# Patient Record
Sex: Male | Born: 1965 | Race: White | Hispanic: No | Marital: Married
Health system: Western US, Academic
[De-identification: ages and names within clinical notes are randomized; demographics above are authoritative.]

## PROBLEM LIST (undated history)

## (undated) DIAGNOSIS — I10 Essential (primary) hypertension: Secondary | ICD-10-CM

## (undated) DIAGNOSIS — E785 Hyperlipidemia, unspecified: Secondary | ICD-10-CM

## (undated) DIAGNOSIS — G4730 Sleep apnea, unspecified: Secondary | ICD-10-CM

## (undated) MED ORDER — AMINOLEVULINIC ACID HCL 30 MG/ML ORAL SOLUTION
30 | ORAL | Status: AC
Start: ? — End: 2020-01-12

## (undated) MED ORDER — LACTATED RINGERS INTRAVENOUS SOLUTION: INTRAVENOUS | Status: AC

---
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
## 2018-01-17 ENCOUNTER — Emergency Department (HOSPITAL_COMMUNITY): Payer: Medicaid Other

## 2018-01-17 ENCOUNTER — Encounter (HOSPITAL_COMMUNITY): Payer: Self-pay

## 2018-01-17 ENCOUNTER — Other Ambulatory Visit: Payer: Self-pay

## 2018-01-17 ENCOUNTER — Inpatient Hospital Stay (HOSPITAL_COMMUNITY)
Admission: EM | Admit: 2018-01-17 | Discharge: 2018-01-19 | DRG: 195 | Disposition: A | Discharge status: Home / Self Care | Payer: Medicaid Other | Attending: Internal Medicine | Admitting: Internal Medicine

## 2018-01-17 DIAGNOSIS — I10 Essential (primary) hypertension: Secondary | ICD-10-CM | POA: Diagnosis present

## 2018-01-17 DIAGNOSIS — J13 Pneumonia due to Streptococcus pneumoniae: Secondary | ICD-10-CM | POA: Diagnosis not present

## 2018-01-17 DIAGNOSIS — Z7982 Long term (current) use of aspirin: Secondary | ICD-10-CM | POA: Diagnosis not present

## 2018-01-17 DIAGNOSIS — J181 Lobar pneumonia, unspecified organism: Secondary | ICD-10-CM

## 2018-01-17 DIAGNOSIS — E782 Mixed hyperlipidemia: Secondary | ICD-10-CM | POA: Diagnosis not present

## 2018-01-17 DIAGNOSIS — Z79899 Other long term (current) drug therapy: Secondary | ICD-10-CM | POA: Diagnosis not present

## 2018-01-17 DIAGNOSIS — R0902 Hypoxemia: Secondary | ICD-10-CM | POA: Diagnosis present

## 2018-01-17 DIAGNOSIS — E785 Hyperlipidemia, unspecified: Secondary | ICD-10-CM | POA: Diagnosis present

## 2018-01-17 DIAGNOSIS — J180 Bronchopneumonia, unspecified organism: Principal | ICD-10-CM | POA: Diagnosis present

## 2018-01-17 DIAGNOSIS — G4733 Obstructive sleep apnea (adult) (pediatric): Secondary | ICD-10-CM | POA: Diagnosis present

## 2018-01-17 DIAGNOSIS — J189 Pneumonia, unspecified organism: Secondary | ICD-10-CM | POA: Diagnosis present

## 2018-01-17 DIAGNOSIS — E7841 Elevated Lipoprotein(a): Secondary | ICD-10-CM | POA: Diagnosis not present

## 2018-01-17 HISTORY — DX: Essential (primary) hypertension: I10

## 2018-01-17 HISTORY — DX: Sleep apnea, unspecified: G47.30

## 2018-01-17 HISTORY — DX: Hyperlipidemia, unspecified: E78.5

## 2018-01-17 LAB — URINALYSIS, ROUTINE W REFLEX MICROSCOPIC
BILIRUBIN URINE: NEGATIVE
GLUCOSE, UA: NEGATIVE mg/dL
HGB URINE DIPSTICK: NEGATIVE
Ketones, ur: NEGATIVE mg/dL
Leukocytes, UA: NEGATIVE
Nitrite: NEGATIVE
PH: 6 (ref 5.0–8.0)
Protein, ur: NEGATIVE mg/dL
SPECIFIC GRAVITY, URINE: 1.006 (ref 1.005–1.030)

## 2018-01-17 LAB — CBC WITH DIFFERENTIAL/PLATELET
BASOS ABS: 0 10*3/uL (ref 0.0–0.1)
Basophils Relative: 0 %
EOS PCT: 1 %
Eosinophils Absolute: 0.1 10*3/uL (ref 0.0–0.7)
HCT: 39.8 % (ref 39.0–52.0)
Hemoglobin: 14.1 g/dL (ref 13.0–17.0)
LYMPHS PCT: 21 %
Lymphs Abs: 1.8 10*3/uL (ref 0.7–4.0)
MCH: 34.6 pg — ABNORMAL HIGH (ref 26.0–34.0)
MCHC: 35.4 g/dL (ref 30.0–36.0)
MCV: 97.8 fL (ref 78.0–100.0)
MONO ABS: 1.3 10*3/uL — AB (ref 0.1–1.0)
MONOS PCT: 15 %
NEUTROS PCT: 63 %
Neutro Abs: 5.5 10*3/uL (ref 1.7–7.7)
PLATELETS: 200 10*3/uL (ref 150–400)
RBC: 4.07 MIL/uL — ABNORMAL LOW (ref 4.22–5.81)
RDW: 13.5 % (ref 11.5–15.5)
WBC: 8.8 10*3/uL (ref 4.0–10.5)

## 2018-01-17 LAB — BASIC METABOLIC PANEL
ANION GAP: 8 (ref 5–15)
BUN: 15 mg/dL (ref 6–20)
CHLORIDE: 104 mmol/L (ref 98–111)
CO2: 26 mmol/L (ref 22–32)
CREATININE: 1.3 mg/dL — AB (ref 0.61–1.24)
Calcium: 9.4 mg/dL (ref 8.9–10.3)
GFR calc Af Amer: >60 mL/min (ref >60)
GFR calc non Af Amer: >60 mL/min (ref >60)
Glucose, Bld: 166 mg/dL — ABNORMAL HIGH (ref 70–99)
Potassium: 3.8 mmol/L (ref 3.5–5.1)
Sodium: 138 mmol/L (ref 135–145)

## 2018-01-17 LAB — I-STAT TROPONIN, ED: Troponin i, poc: 0.01 ng/mL (ref 0.00–0.08)

## 2018-01-17 MED ORDER — IOPAMIDOL (ISOVUE-370) INJECTION 76%
100.0000 mL | Freq: Once | INTRAVENOUS | Status: AC | PRN
  Administered 2018-01-17: 100 mL via INTRAVENOUS

## 2018-01-17 MED ORDER — SODIUM CHLORIDE 0.9 % IV BOLUS
1000.0000 mL | Freq: Once | INTRAVENOUS | Status: AC
  Administered 2018-01-17: 1000 mL via INTRAVENOUS

## 2018-01-17 MED ORDER — SODIUM CHLORIDE 0.9 % IV SOLN
1.0000 g | Freq: Once | INTRAVENOUS | Status: AC
  Administered 2018-01-17: 1 g via INTRAVENOUS
  Filled 2018-01-17: qty 10

## 2018-01-17 MED ORDER — SODIUM CHLORIDE 0.9 % IV SOLN
500.0000 mg | Freq: Once | INTRAVENOUS | Status: AC
  Administered 2018-01-17: 500 mg via INTRAVENOUS
  Filled 2018-01-17: qty 500

## 2018-01-17 MED ORDER — IOPAMIDOL (ISOVUE-370) INJECTION 76%
INTRAVENOUS | Status: AC
  Filled 2018-01-17: qty 100

## 2018-01-17 MED ORDER — IPRATROPIUM-ALBUTEROL 0.5-2.5 (3) MG/3ML IN SOLN
3.0000 mL | Freq: Once | RESPIRATORY_TRACT | Status: AC
  Administered 2018-01-17: 3 mL via RESPIRATORY_TRACT
  Filled 2018-01-17: qty 3

## 2018-01-17 NOTE — ED Notes (Signed)
AWARE OF NEED FOR URINE 

## 2018-01-17 NOTE — ED Triage Notes (Signed)
Per [facility]- Pt resides at home- Seen at [facility] DX pneumonia Neg flu. C/o shortness of breath, respiratory distress, body aches, fever, chest wall with upon coughing x 1 week 02 sat 81 % RA Placed on 4LNC Wheezing and Rhonchi throughout. 5 mg albuterol NEB and 125 mg  solumedrol IVP in route. Tylenol 1000 mg po given for fever. Dark urine for 4 days.

## 2018-01-17 NOTE — ED Notes (Signed)
ED TO INPATIENT HANDOFF REPORT  Name/Age/Gender Richard Stevenson 52 y.o. male  Code Status   Home/SNF/Other Home  Chief Complaint Respiratory Distress  Level of Care/Admitting Diagnosis ED Disposition    ED Disposition Condition [facility] Area: [place] [[id number]]  Level of Care: Telemetry [5]  Admit to tele based on following criteria: Other see comments  Comments: hypoxia  Diagnosis: Pneumonia [614431]  Admitting Physician: Elwyn Reach [[id number]]  Attending Physician: Elwyn Reach [[id number]]  Estimated length of stay: past midnight tomorrow  Certification:: I certify this patient will need inpatient services for at least 2 midnights  PT Class (Do Not Modify): Inpatient [101]  PT Acc Code (Do Not Modify): Private [1]       Medical History Past Medical History:  Diagnosis Date  . Hyperlipidemia   . Hypertension   . Sleep apnea     Allergies No Known Allergies  IV Location/Drains/Wounds Patient Lines/Drains/Airways Status   Active Line/Drains/Airways    Name:   Placement date:   Placement time:   Site:   Days:   Peripheral IV 01/17/18 Right Antecubital   01/17/18    1632    Antecubital   less than 1          Labs/Imaging Results for orders placed or performed during the hospital encounter of 01/17/18 (from the past 48 hour(s))  Basic metabolic panel     Status: Abnormal   Collection Time: 01/17/18  5:05 PM  Result Value Ref Range   Sodium 138 135 - 145 mmol/L   Potassium 3.8 3.5 - 5.1 mmol/L   Chloride 104 98 - 111 mmol/L   CO2 26 22 - 32 mmol/L   Glucose, Bld 166 (H) 70 - 99 mg/dL   BUN 15 6 - 20 mg/dL   Creatinine, Ser 1.30 (H) 0.61 - 1.24 mg/dL   Calcium 9.4 8.9 - 10.3 mg/dL   GFR calc non Af Amer >60 >60 mL/min   GFR calc Af Amer >60 >60 mL/min    Comment: (NOTE) The eGFR has been calculated using the CKD EPI equation. This calculation has not been validated in all clinical situations. eGFR's persistently  <60 mL/min signify possible Chronic Kidney Disease.    Anion gap 8 5 - 15    Comment: Performed at [facility], [place] [address] [place], [place] [address]  CBC with Differential     Status: Abnormal   Collection Time: 01/17/18  5:05 PM  Result Value Ref Range   WBC 8.8 4.0 - 10.5 K/uL   RBC 4.07 (L) 4.22 - 5.81 MIL/uL   Hemoglobin 14.1 13.0 - 17.0 g/dL   HCT 39.8 39.0 - 52.0 %   MCV 97.8 78.0 - 100.0 fL   MCH 34.6 (H) 26.0 - 34.0 pg   MCHC 35.4 30.0 - 36.0 g/dL   RDW 13.5 11.5 - 15.5 %   Platelets 200 150 - 400 K/uL   Neutrophils Relative % 63 %   Neutro Abs 5.5 1.7 - 7.7 K/uL   Lymphocytes Relative 21 %   Lymphs Abs 1.8 0.7 - 4.0 K/uL   Monocytes Relative 15 %   Monocytes Absolute 1.3 (H) 0.1 - 1.0 K/uL   Eosinophils Relative 1 %   Eosinophils Absolute 0.1 0.0 - 0.7 K/uL   Basophils Relative 0 %   Basophils Absolute 0.0 0.0 - 0.1 K/uL    Comment: Performed at [facility], [address] [place], [place]  [address]  Urinalysis, Routine w reflex microscopic     Status: None   Collection Time: 01/17/18  5:05 PM  Result Value Ref Range   Color, Urine YELLOW YELLOW   APPearance CLEAR CLEAR   Specific Gravity, Urine 1.006 1.005 - 1.030   pH 6.0 5.0 - 8.0   Glucose, UA NEGATIVE NEGATIVE mg/dL   Hgb urine dipstick NEGATIVE NEGATIVE   Bilirubin Urine NEGATIVE NEGATIVE   Ketones, ur NEGATIVE NEGATIVE mg/dL   Protein, ur NEGATIVE NEGATIVE mg/dL   Nitrite NEGATIVE NEGATIVE   Leukocytes, UA NEGATIVE NEGATIVE    Comment: Performed at [facility], [place] [address], [place], [place] [address]  I-stat troponin, ED     Status: None   Collection Time: 01/17/18  5:11 PM  Result Value Ref Range   Troponin i, poc 0.01 0.00 - 0.08 ng/mL   Comment 3            Comment: Due to the release kinetics of cTnI, a negative result within the first hours of the onset of symptoms does not rule out myocardial infarction with certainty. If  myocardial infarction is still suspected, repeat the test at appropriate intervals.    Dg Chest 2 View  Result Date: 01/17/2018 CLINICAL DATA:  One-week history of cough, shortness of breath, myalgias, fever and chest wall pain with coughing. Hypoxemia. Wheezing and ronchi on auscultatory examination. EXAM: CHEST - 2 VIEW COMPARISON:  None. FINDINGS: Cardiac silhouette upper normal in size. Hilar and mediastinal contours unremarkable. Streaky and patchy opacities involving the LEFT LOWER LOBE. Lungs otherwise clear. Pulmonary vascularity normal. No pleural effusions. Visualized bony thorax intact. IMPRESSION: LEFT LOWER LOBE bronchopneumonia. Electronically Signed   By: Evangeline Dakin M.D.   On: 01/17/2018 17:53   Ct Angio Chest Pe W/cm &/or Wo Cm  Result Date: 01/17/2018 CLINICAL DATA:  shortness of breath, respiratory distress, body aches, fever, chest wall with upon coughing x 1 week 02 sat 81 % RA Placed on 4LNC Wheezing and Rhonchi throughout. 5 mg albuterol NEB and 125 mg solumedrol IVP in routePatient coughed through entire first sequence, repeat bolus administered EXAM: CT ANGIOGRAPHY CHEST WITH CONTRAST TECHNIQUE: Multidetector CT imaging of the chest was performed using the standard protocol during bolus administration of intravenous contrast. Multiplanar CT image reconstructions and MIPs were obtained to evaluate the vascular anatomy. CONTRAST:  139m ISOVUE-370 IOPAMIDOL (ISOVUE-370) INJECTION 76% COMPARISON:  None. FINDINGS: Cardiovascular: Heart size upper limits normal. No pericardial effusion. Satisfactory opacification of pulmonary arteries noted, and there is no evidence of pulmonary emboli. Adequate contrast opacification of the thoracic aorta with no evidence of dissection, aneurysm, or stenosis. There is classic 3-vessel brachiocephalic arch anatomy without proximal stenosis. No significant atheromatous plaque. Visualized proximal abdominal aorta unremarkable. Mediastinum/Nodes:  Inferior right hilar lymph node 1.3 cm diameter. No other hilar or mediastinal adenopathy. Lungs/Pleura: Scattered Airspace opacities in the basilar segments of the left lower lobe. Right lung clear. No pleural effusion. No pneumothorax. Upper Abdomen: 3 cm soft tissue attenuation left adrenal nodule. No acute findings. Musculoskeletal: No chest wall abnormality. No acute or significant osseous findings. Review of the MIP images confirms the above findings. IMPRESSION: 1. Negative for acute PE or thoracic aortic dissection. 2. Scattered airspace opacities in the left lower lobe suggesting pneumonia. 3. Single enlarged inferior left hilar lymph node, possibly reactive but nonspecific. 4. 3 cm left adrenal nodule, statistically most likely adenoma in the absence of a history of primary carcinoma although the CT findings are not  diagnostic. When the patient is clinically stable and able to follow directions and hold their breath (preferably as an outpatient) further evaluation with dedicated abdominal MRI should be considered. Electronically Signed   By: Lucrezia Europe M.D.   On: 01/17/2018 19:45    Pending Labs Unresulted Labs (From admission, onward)   None      Vitals/Pain Today's Vitals   01/17/18 1717 01/17/18 1809 01/17/18 1830 01/17/18 1858  BP:  139/61 (!) 162/66   Pulse:  82 83   Resp:  18 20   Temp:  99 F (37.2 C)    TempSrc:  Oral    SpO2:  91% 93%   Weight: 117 kg     Height: _0  (1.803 m)     PainSc:    1     Isolation Precautions No active isolations  Medications Medications  azithromycin (ZITHROMAX) 500 mg in sodium chloride 0.9 % 250 mL IVPB (500 mg Intravenous New Bag/Given 01/17/18 1954)  iopamidol (ISOVUE-370) 76 % injection (has no administration in time range)  ipratropium-albuterol (DUONEB) 0.5-2.5 (3) MG/3ML nebulizer solution 3 mL (3 mLs Nebulization Given 01/17/18 1707)  sodium chloride 0.9 % bolus 1,000 mL (0 mLs Intravenous Stopped 01/17/18 1915)  cefTRIAXone  (ROCEPHIN) 1 g in sodium chloride 0.9 % 100 mL IVPB (0 g Intravenous Stopped 01/17/18 1951)  iopamidol (ISOVUE-370) 76 % injection 100 mL (100 mLs Intravenous Contrast Given 01/17/18 1850)    Mobility walks

## 2018-01-17 NOTE — ED Notes (Signed)
Still unable to void at this time.

## 2018-01-17 NOTE — ED Notes (Signed)
Bed: [id number] Expected date: 01/17/18 Expected time: 4:22 PM Means of arrival: Ambulance Comments: Resp diff, neb treatment

## 2018-01-17 NOTE — H&P (Signed)
History and Physical   [name] WUJ:[id number] DOB: 31-Aug-1965 DOA: 01/17/2018  Referring MD/NP/PA: Dietrich Pates, PA  PCP: Patient, No Pcp Per   Patient coming from: Home  Chief Complaint: Shortness of breath cough and fever  HPI: Richard Stevenson is a 52 y.o. male with medical history significant of with history of obstructive sleep apnea on CPAP, hypertension, hyperlipidemia who apparently has been using humidifier on his CPAP but has not used it for about a week.  It has dried out.  He normally cleans it with special liquid and has been doing that without any problem.  He started having generalized fever chills cough and shortness of breath about 10 days now.  This has progressed to become more productive of yellow mucus.  Symptoms have gotten worse in the last 3 days patient is having exertional dyspnea.  He came to the ER where he was evaluated.  He was found to have evidence of pneumonia.  Patient was also hypoxic requiring 3 L of oxygen to maintain oxygen sat of 92%.  He denied any sick contact.  Denied any recent travel.  Patient believes his CPAP has something to do with his symptoms.  He has taken Mucinex but no antibiotics in the last 4 days.  He has not been on oxygen and notes smoking history.  No history of asthma.  Patient is therefore being admitted with pneumonia requiring oxygen for full evaluation..  ED Course: Patient's temperature is 100.4, his blood pressure 162/66 pulse 87 respiratory of 21 oxygen sat 81% on room air and 93% on 3 L.  His chemistry appears stable except for creatinine 1.3 and glucose of 166 white count is 8.8 hemoglobin 14.1 platelets 200.  Urinalysis is entirely negative.  Chest x-ray showed left lower lobe bronchopneumonia, CT angiogram of the chest showed no PE but scattered airspace opacities in the left lower lobe suggesting pneumonia  Review of Systems: As per HPI otherwise 10 point review of systems negative.    Past Medical History:  Diagnosis Date    . Hyperlipidemia   . Hypertension   . Sleep apnea     History reviewed. No pertinent surgical history.   reports that he has never smoked. He does not have any smokeless tobacco history on file. He reports that he drank alcohol. He reports that he does not use drugs.  No Known Allergies  History reviewed. No pertinent family history.   Prior to Admission medications   Medication Sig Start Date End Date Taking? Authorizing Provider  aspirin EC 81 MG tablet Take 81 mg by mouth daily.   Yes [provider]  cholecalciferol (VITAMIN D) 1000 units tablet Take 1,000 Units by mouth daily.   Yes [provider]  Cyanocobalamin (VITAMIN B-12 CR PO) Take 1 tablet by mouth daily.   Yes [provider]  guaiFENesin (MUCINEX) 600 MG 12 hr tablet Take 600 mg by mouth 2 (two) times daily as needed for cough or to loosen phlegm.   Yes [provider]  Multiple Vitamin (MULTIVITAMIN WITH MINERALS) TABS tablet Take 1 tablet by mouth daily.   Yes [provider]  pyridOXINE (VITAMIN B-6) 100 MG tablet Take 100 mg by mouth daily.   Yes [provider]    Physical Exam: Vitals:   01/17/18 1717 01/17/18 1809 01/17/18 1830 01/17/18 2015  BP:  139/61 (!) 162/66 134/64  Pulse:  82 83 69  Resp:  18 20 20   Temp:  99 F (37.2 C)  TempSrc:  Oral    SpO2:  91% 93% 93%  Weight: 117 kg     Height: 5\' 11"  (1.803 m)         Constitutional: NAD, calm, comfortable Vitals:   01/17/18 1717 01/17/18 1809 01/17/18 1830 01/17/18 2015  BP:  139/61 (!) 162/66 134/64  Pulse:  82 83 69  Resp:  18 20 20   Temp:  99 F (37.2 C)    TempSrc:  Oral    SpO2:  91% 93% 93%  Weight: 117 kg     Height: 5\' 11"  (1.803 m)      Eyes: PERRL, lids and conjunctivae normal ENMT: Mucous membranes are moist. Posterior pharynx clear of any exudate or lesions.Normal dentition.  Neck: normal, supple, no masses, no thyromegaly Respiratory: clear to auscultation  bilaterally, no wheezing, no crackles. Normal respiratory effort. No accessory muscle use.  Cardiovascular: Regular rate and rhythm, no murmurs / rubs / gallops. No extremity edema. 2+ pedal pulses. No carotid bruits.  Abdomen: no tenderness, no masses palpated. No hepatosplenomegaly. Bowel sounds positive.  Musculoskeletal: no clubbing / cyanosis. No joint deformity upper and lower extremities. Good ROM, no contractures. Normal muscle tone.  Skin: no rashes, lesions, ulcers. No induration Neurologic: CN 2-12 grossly intact. Sensation intact, DTR normal. Strength 5/5 in all 4.  Psychiatric: Normal judgment and insight. Alert and oriented x 3. Normal mood.     Labs on Admission: I have personally reviewed following labs and imaging studies  CBC: Recent Labs  Lab 01/17/18 1705  WBC 8.8  NEUTROABS 5.5  HGB 14.1  HCT 39.8  MCV 97.8  PLT 200   Basic Metabolic Panel: Recent Labs  Lab 01/17/18 1705  NA 138  K 3.8  CL 104  CO2 26  GLUCOSE 166*  BUN 15  CREATININE 1.30*  CALCIUM 9.4   GFR: Estimated Creatinine Clearance: 85.5 mL/min (A) (by C-G formula based on SCr of 1.3 mg/dL (H)). Liver Function Tests: No results for input(s): AST, ALT, ALKPHOS, BILITOT, PROT, ALBUMIN in the last 168 hours. No results for input(s): LIPASE, AMYLASE in the last 168 hours. No results for input(s): AMMONIA in the last 168 hours. Coagulation Profile: No results for input(s): INR, PROTIME in the last 168 hours. Cardiac Enzymes: No results for input(s): CKTOTAL, CKMB, CKMBINDEX, TROPONINI in the last 168 hours. BNP (last 3 results) No results for input(s): PROBNP in the last 8760 hours. HbA1C: No results for input(s): HGBA1C in the last 72 hours. CBG: No results for input(s): GLUCAP in the last 168 hours. Lipid Profile: No results for input(s): CHOL, HDL, LDLCALC, TRIG, CHOLHDL, LDLDIRECT in the last 72 hours. Thyroid Function Tests: No results for input(s): TSH, T4TOTAL, FREET4, T3FREE,  THYROIDAB in the last 72 hours. Anemia Panel: No results for input(s): VITAMINB12, FOLATE, FERRITIN, TIBC, IRON, RETICCTPCT in the last 72 hours. Urine analysis:    Component Value Date/Time   COLORURINE YELLOW 01/17/2018 1705   APPEARANCEUR CLEAR 01/17/2018 1705   LABSPEC 1.006 01/17/2018 1705   PHURINE 6.0 01/17/2018 1705   GLUCOSEU NEGATIVE 01/17/2018 1705   HGBUR NEGATIVE 01/17/2018 1705   BILIRUBINUR NEGATIVE 01/17/2018 1705   KETONESUR NEGATIVE 01/17/2018 1705   PROTEINUR NEGATIVE 01/17/2018 1705   NITRITE NEGATIVE 01/17/2018 1705   LEUKOCYTESUR NEGATIVE 01/17/2018 1705   Sepsis Labs: @LABRCNTIP (procalcitonin:4,lacticidven:4) )No results found for this or any previous visit (from the past 240 hour(s)).   Radiological Exams on Admission: Dg Chest 2 View  Result Date: 01/17/2018 CLINICAL DATA:  One-week  history of cough, shortness of breath, myalgias, fever and chest wall pain with coughing. Hypoxemia. Wheezing and ronchi on auscultatory examination. EXAM: CHEST - 2 VIEW COMPARISON:  None. FINDINGS: Cardiac silhouette upper normal in size. Hilar and mediastinal contours unremarkable. Streaky and patchy opacities involving the LEFT LOWER LOBE. Lungs otherwise clear. Pulmonary vascularity normal. No pleural effusions. Visualized bony thorax intact. IMPRESSION: LEFT LOWER LOBE bronchopneumonia. Electronically Signed   By: Hulan Saashomas  Lawrence M.D.   On: 01/17/2018 17:53   Ct Angio Chest Pe W/cm &/or Wo Cm  Result Date: 01/17/2018 CLINICAL DATA:  shortness of breath, respiratory distress, body aches, fever, chest wall with upon coughing x 1 week 02 sat 81 % RA Placed on 4LNC Wheezing and Rhonchi throughout. 5 mg albuterol NEB and 125 mg solumedrol IVP in routePatient coughed through entire first sequence, repeat bolus administered EXAM: CT ANGIOGRAPHY CHEST WITH CONTRAST TECHNIQUE: Multidetector CT imaging of the chest was performed using the standard protocol during bolus administration of  intravenous contrast. Multiplanar CT image reconstructions and MIPs were obtained to evaluate the vascular anatomy. CONTRAST:  100mL ISOVUE-370 IOPAMIDOL (ISOVUE-370) INJECTION 76% COMPARISON:  None. FINDINGS: Cardiovascular: Heart size upper limits normal. No pericardial effusion. Satisfactory opacification of pulmonary arteries noted, and there is no evidence of pulmonary emboli. Adequate contrast opacification of the thoracic aorta with no evidence of dissection, aneurysm, or stenosis. There is classic 3-vessel brachiocephalic arch anatomy without proximal stenosis. No significant atheromatous plaque. Visualized proximal abdominal aorta unremarkable. Mediastinum/Nodes: Inferior right hilar lymph node 1.3 cm diameter. No other hilar or mediastinal adenopathy. Lungs/Pleura: Scattered Airspace opacities in the basilar segments of the left lower lobe. Right lung clear. No pleural effusion. No pneumothorax. Upper Abdomen: 3 cm soft tissue attenuation left adrenal nodule. No acute findings. Musculoskeletal: No chest wall abnormality. No acute or significant osseous findings. Review of the MIP images confirms the above findings. IMPRESSION: 1. Negative for acute PE or thoracic aortic dissection. 2. Scattered airspace opacities in the left lower lobe suggesting pneumonia. 3. Single enlarged inferior left hilar lymph node, possibly reactive but nonspecific. 4. 3 cm left adrenal nodule, statistically most likely adenoma in the absence of a history of primary carcinoma although the CT findings are not diagnostic. When the patient is clinically stable and able to follow directions and hold their breath (preferably as an outpatient) further evaluation with dedicated abdominal MRI should be considered. Electronically Signed   By: Corlis Leak  Hassell M.D.   On: 01/17/2018 19:45    EKG: Independently reviewed.  Normal sinus rhythm.  Assessment/Plan Active Problems:   Pneumonia   OSA (obstructive sleep apnea)   HTN  (hypertension)   Hyperlipidemia     #1 left lower lobe pneumonia: Most likely community-acquired.  Patient worried about his CPAP being implicated.  Less likely to be Legionella more likely to be pneumococcal.  We will admit the patient and start him on Rocephin and Zithromax.  Get sputum for culture and sensitivity.  Screen for both streptococcal antigen as well as Legionella.  Maintain on oxygen and titrate off.  #2 acute hypoxemia: No history of COPD or asthma.  Not on home O2.  Patient is currently on 3 L of oxygen.  Titrate oxygen of if possible.  #3 obstructive sleep apnea: Patient has CPAP from home.  Continue CPAP in the hospital.  #4 hypertension: Continue monitoring.  Not on any medication at the moment.  Blood pressure appears controlled  #5 hyperlipidemia: Continue home regimen as well.  Not on any statin.  Dietary control palpation   DVT prophylaxis: Left Code Status: Full code Family Communication: No family available  disposition Plan: Home Consults called: None Admission status: Inpatient to telemetry  Severity of Illness: The appropriate patient status for this patient is INPATIENT. Inpatient status is judged to be reasonable and necessary in order to provide the required intensity of service to ensure the patient's safety. The patient's presenting symptoms, physical exam findings, and initial radiographic and laboratory data in the context of their chronic comorbidities is felt to place them at high risk for further clinical deterioration. Furthermore, it is not anticipated that the patient will be medically stable for discharge from the hospital within 2 midnights of admission. The following factors support the patient status of inpatient.   " The patient's presenting symptoms include cough shortness of breath and wheezing. " The worrisome physical exam findings include bilateral crackles and decreased air entry with hypoxemia. " The initial radiographic and laboratory  data are worrisome because of left bronchopneumonia on chest x-ray and CT scan. " The chronic co-morbidities include obstructive sleep.   * I certify that at the point of admission it is my clinical judgment that the patient will require inpatient hospital care spanning beyond 2 midnights from the point of admission due to high intensity of service, high risk for further deterioration and high frequency of surveillance required.Lonia Blood MD [facility] Pager [phone]  If 7PM-7AM, please contact night-coverage [email or web address] [facility]  01/17/2018, 9:42 PM

## 2018-01-17 NOTE — ED Notes (Signed)
Patient transported to X-ray 

## 2018-01-17 NOTE — ED Notes (Signed)
PT 02 drop to 85% while ambulating on 3L 93% on 4L

## 2018-01-17 NOTE — ED Notes (Signed)
Pt given an urinal and made aware of need for urine specimen. 

## 2018-01-17 NOTE — ED Provider Notes (Addendum)
[facility]-EMERGENCY DEPT Provider Note   CSN: [id number] Arrival date & time: 01/17/18  1624     History   Chief Complaint Chief Complaint  Patient presents with  . Shortness of Breath  . Fever  . Generalized Body Aches  . Pneumonia    HPI Richard SolianMark Stevenson is a 52 y.o. male with a past medical history of hypertension, hyperlipidemia not currently on medication for either, OSA on CPAP nightly, who presents to ED for evaluation of 10-day history of shortness of breath, subjective fever, generalized body aches, cough productive with mucus, chest discomfort that has worsened in the past 3 days.  Patient was seen and evaluated in minute clinic and was told to come to the ED for respiratory distress and possible pneumonia.  Negative flu test done there.  Patient states that he took a few doses of over-the-counter Mucinex which he thought helped his symptoms 4 days ago.  He does not wear home oxygen.  He denies any tobacco use.  Patient was given Tylenol prior to arrival.  He reports decreased appetite as well.  Denies any tick bites, rashes, leg swelling, recent surgeries, recent prolonged travel, hormone use, history of PE, MI or DVT or sick contacts with similar symptoms. Patient discloses to me that he is unsure if his respiratory infection is related to the distilled water that he uses in his CPAP machine last week.  He ensures me that he keeps up with the maintenance of his CPAP machine and cleans it regularly.  HPI  Past Medical History:  Diagnosis Date  . Hyperlipidemia   . Hypertension   . Sleep apnea     There are no active problems to display for this patient.   History reviewed. No pertinent surgical history.      Home Medications    Prior to Admission medications   Medication Sig Start Date End Date Taking? Authorizing Provider  aspirin EC 81 MG tablet Take 81 mg by mouth daily.   Yes [provider]  cholecalciferol (VITAMIN D) 1000  units tablet Take 1,000 Units by mouth daily.   Yes [provider]  Cyanocobalamin (VITAMIN B-12 CR PO) Take 1 tablet by mouth daily.   Yes [provider]  guaiFENesin (MUCINEX) 600 MG 12 hr tablet Take 600 mg by mouth 2 (two) times daily as needed for cough or to loosen phlegm.   Yes [provider]  Multiple Vitamin (MULTIVITAMIN WITH MINERALS) TABS tablet Take 1 tablet by mouth daily.   Yes [provider]  pyridOXINE (VITAMIN B-6) 100 MG tablet Take 100 mg by mouth daily.   Yes [provider]    Family History History reviewed. No pertinent family history.  Social History Social History   Tobacco Use  . Smoking status: Never Smoker  Substance Use Topics  . Alcohol use: Not Currently  . Drug use: Never     Allergies   Patient has no known allergies.   Review of Systems Review of Systems  Constitutional: Positive for chills. Negative for appetite change and fever.  HENT: Negative for ear pain, rhinorrhea, sneezing and sore throat.   Eyes: Negative for photophobia and visual disturbance.  Respiratory: Positive for cough, shortness of breath and wheezing. Negative for chest tightness.   Cardiovascular: Positive for chest pain. Negative for palpitations.  Gastrointestinal: Negative for abdominal pain, blood in stool, constipation, diarrhea, nausea and vomiting.  Genitourinary: Negative for dysuria, hematuria and urgency.  Musculoskeletal: Positive for myalgias.  Skin:  Negative for rash.  Neurological: Negative for dizziness, weakness and light-headedness.     Physical Exam Updated Vital Signs BP (!) 162/66   Pulse 83   Temp 99 F (37.2 C) (Oral)   Resp 20   Ht 5\' 11"  (1.803 m)   Wt 117 kg   SpO2 93%   BMI 35.98 kg/m   Physical Exam  Constitutional: He appears well-developed and well-nourished. No distress.  3L of oxygen being delivered via nasal cannula.  HENT:  Head: Normocephalic and atraumatic.  Nose: Nose  normal.  Eyes: Conjunctivae and EOM are normal. Left eye exhibits no discharge. No scleral icterus.  Neck: Normal range of motion. Neck supple.  Cardiovascular: Normal rate, regular rhythm, normal heart sounds and intact distal pulses. Exam reveals no gallop and no friction rub.  No murmur heard. Pulmonary/Chest: Effort normal. Tachypnea noted. No respiratory distress. He has decreased breath sounds in the left middle field and the left lower field. He has wheezes in the left middle field and the left lower field.  Abdominal: Soft. Bowel sounds are normal. He exhibits no distension. There is no tenderness. There is no guarding.  Musculoskeletal: Normal range of motion. He exhibits no edema.  No lower extremity edema, erythema or calf tenderness bilaterally.  Neurological: He is alert. He exhibits normal muscle tone. Coordination normal.  Skin: Skin is warm and dry. No rash noted.  Psychiatric: He has a normal mood and affect.  Nursing note and vitals reviewed.    ED Treatments / Results  Troponin negative.  EKG shows normal sinus rhythm.  Labs (all labs ordered are listed, but only abnormal results are displayed) Labs Reviewed  BASIC METABOLIC PANEL - Abnormal; Notable for the following components:      Result Value   Glucose, Bld 166 (*)    Creatinine, Ser 1.30 (*)    All other components within normal limits  CBC WITH DIFFERENTIAL/PLATELET - Abnormal; Notable for the following components:   RBC 4.07 (*)    MCH 34.6 (*)    Monocytes Absolute 1.3 (*)    All other components within normal limits  URINALYSIS, ROUTINE W REFLEX MICROSCOPIC  I-STAT TROPONIN, ED    EKG None  Radiology Dg Chest 2 View  Result Date: 01/17/2018 CLINICAL DATA:  One-week history of cough, shortness of breath, myalgias, fever and chest wall pain with coughing. Hypoxemia. Wheezing and ronchi on auscultatory examination. EXAM: CHEST - 2 VIEW COMPARISON:  None. FINDINGS: Cardiac silhouette upper normal in  size. Hilar and mediastinal contours unremarkable. Streaky and patchy opacities involving the LEFT LOWER LOBE. Lungs otherwise clear. Pulmonary vascularity normal. No pleural effusions. Visualized bony thorax intact. IMPRESSION: LEFT LOWER LOBE bronchopneumonia. Electronically Signed   By: Hulan Saas M.D.   On: 01/17/2018 17:53   Ct Angio Chest Pe W/cm &/or Wo Cm  Result Date: 01/17/2018 CLINICAL DATA:  shortness of breath, respiratory distress, body aches, fever, chest wall with upon coughing x 1 week 02 sat 81 % RA Placed on 4LNC Wheezing and Rhonchi throughout. 5 mg albuterol NEB and 125 mg solumedrol IVP in routePatient coughed through entire first sequence, repeat bolus administered EXAM: CT ANGIOGRAPHY CHEST WITH CONTRAST TECHNIQUE: Multidetector CT imaging of the chest was performed using the standard protocol during bolus administration of intravenous contrast. Multiplanar CT image reconstructions and MIPs were obtained to evaluate the vascular anatomy. CONTRAST:  ISOVUE-370 IOPAMIDOL (ISOVUE-370) INJECTION 76% COMPARISON:  None. FINDINGS: Cardiovascular: Heart size upper limits normal. No pericardial effusion. Satisfactory opacification  of pulmonary arteries noted, and there is no evidence of pulmonary emboli. Adequate contrast opacification of the thoracic aorta with no evidence of dissection, aneurysm, or stenosis. There is classic 3-vessel brachiocephalic arch anatomy without proximal stenosis. No significant atheromatous plaque. Visualized proximal abdominal aorta unremarkable. Mediastinum/Nodes: Inferior right hilar lymph node 1.3 cm diameter. No other hilar or mediastinal adenopathy. Lungs/Pleura: Scattered Airspace opacities in the basilar segments of the left lower lobe. Right lung clear. No pleural effusion. No pneumothorax. Upper Abdomen: 3 cm soft tissue attenuation left adrenal nodule. No acute findings. Musculoskeletal: No chest wall abnormality. No acute or significant osseous  findings. Review of the MIP images confirms the above findings. IMPRESSION: 1. Negative for acute PE or thoracic aortic dissection. 2. Scattered airspace opacities in the left lower lobe suggesting pneumonia. 3. Single enlarged inferior left hilar lymph node, possibly reactive but nonspecific. 4. 3 cm left adrenal nodule, statistically most likely adenoma in the absence of a history of primary carcinoma although the CT findings are not diagnostic. When the patient is clinically stable and able to follow directions and hold their breath (preferably as an outpatient) further evaluation with dedicated abdominal MRI should be considered. Electronically Signed   By: Corlis Leak M.D.   On: 01/17/2018 19:45    Procedures Procedures (including critical care time)  CRITICAL CARE Performed by: Dietrich Pates   Total critical care time: 35 minutes  Critical care time was exclusive of separately billable procedures and treating other patients.  Critical care was necessary to treat or prevent imminent or life-threatening deterioration.  Critical care was time spent personally by me on the following activities: development of treatment plan with patient and/or surrogate as well as nursing, discussions with consultants, evaluation of patient's response to treatment, examination of patient, obtaining history from patient or surrogate, ordering and performing treatments and interventions, ordering and review of laboratory studies, ordering and review of radiographic studies, pulse oximetry and re-evaluation of patient's condition.  Medications Ordered in ED Medications  azithromycin (ZITHROMAX) 500 mg in sodium chloride 0.9 % 250 mL IVPB (500 mg Intravenous New Bag/Given 01/17/18 1954)  iopamidol (ISOVUE-370) 76 % injection (has no administration in time range)  ipratropium-albuterol (DUONEB) 0.5-2.5 (3) MG/3ML nebulizer solution 3 mL (3 mLs Nebulization Given 01/17/18 1707)  sodium chloride 0.9 % bolus 1,000 mL (0  mLs Intravenous Stopped 01/17/18 1915)  cefTRIAXone (ROCEPHIN) 1 g in sodium chloride 0.9 % 100 mL IVPB (0 g Intravenous Stopped 01/17/18 1951)  iopamidol (ISOVUE-370) 76 % injection 100 mL (100 mLs Intravenous Contrast Given 01/17/18 1850)     Initial Impression / Assessment and Plan / ED Course  I have reviewed the triage vital signs and the nursing notes.  Pertinent labs & imaging results that were available during my care of the patient were reviewed by me and considered in my medical decision making (see chart for details).  Clinical Course as of Jan 17 2042  Sat Jan 17, 2018  1610 Patient reports improvement in breathing with a second breathing treatment given.  Patient continues to be hypoxic even with 3 L of oxygen via nasal cannula.  Will need to obtain CTA to rule out Pe. Patient able to tolerate laying down.   [HK]    Clinical Course User Index [HK] Dietrich Pates, PA-C    52 year old male with a past medical history of hypertension, hyperlipidemia not currently controlled with medication, OSA on CPAP nightly, who presents to ED for evaluation of 1 week history of generalized body  aches, subjective fever, shortness of breath, chest discomfort, productive cough that has worsened over the past 3 days.  Sent here from [facility] for possible pneumonia and respiratory distress.  Patient febrile to 100.4 here with recent use of antipyretics.  He was tachypneic and satting at low 80s on room air.  He was placed on 3 L of oxygen via nasal cannula with improvement to 90 to 93%.  Found to have diffuse wheezing and decreased breath sounds throughout the lung fields.  No lower extremity edema, erythema or calf tenderness bilaterally.  Denies any history of MI, PE.  Chest x-ray shows left lower lobe pneumonia.  CBC with no leukocytosis which is unusual for this presentation.  Urinalysis unremarkable.  Due to presence of hypoxia, age is a risk factor CTA of the chest was done to rule out PE.   This was negative for PE but did redemonstrate the pneumonia.  Patient was given a dose of Rocephin and azithromycin IV here in the ED.  He continues to sat in the low 90s on 3 L of oxygen.  Patient will need to be admitted for new oxygen requirement secondary to pneumonia. Hospitalist to admit.  Portions of this note were generated with Scientist, clinical (histocompatibility and immunogenetics). Dictation errors may occur despite best attempts at proofreading.   Final Clinical Impressions(s) / ED Diagnoses   Final diagnoses:  Community acquired pneumonia of left lower lobe of lung [facility])  Hypoxia    ED Discharge Orders    None         Dietrich Pates, PA-C 01/17/18 2043    Maia Plan, MD 01/18/18 1112

## 2018-01-17 NOTE — ED Notes (Signed)
Patient transported to CT 

## 2018-01-18 ENCOUNTER — Encounter (HOSPITAL_COMMUNITY): Payer: Self-pay

## 2018-01-18 DIAGNOSIS — G4733 Obstructive sleep apnea (adult) (pediatric): Secondary | ICD-10-CM

## 2018-01-18 DIAGNOSIS — J181 Lobar pneumonia, unspecified organism: Secondary | ICD-10-CM

## 2018-01-18 DIAGNOSIS — I10 Essential (primary) hypertension: Secondary | ICD-10-CM

## 2018-01-18 LAB — HEMOGLOBIN A1C
HEMOGLOBIN A1C: 5.6 % (ref 4.8–5.6)
MEAN PLASMA GLUCOSE: 114.02 mg/dL

## 2018-01-18 LAB — COMPREHENSIVE METABOLIC PANEL
ALK PHOS: 104 U/L (ref 38–126)
ALT: 79 U/L — AB (ref 0–44)
AST: 46 U/L — ABNORMAL HIGH (ref 15–41)
Albumin: 3.7 g/dL (ref 3.5–5.0)
Anion gap: 11 (ref 5–15)
BILIRUBIN TOTAL: 0.6 mg/dL (ref 0.3–1.2)
BUN: 14 mg/dL (ref 6–20)
CALCIUM: 9.4 mg/dL (ref 8.9–10.3)
CHLORIDE: 107 mmol/L (ref 98–111)
CO2: 24 mmol/L (ref 22–32)
CREATININE: 1.2 mg/dL (ref 0.61–1.24)
Glucose, Bld: 231 mg/dL — ABNORMAL HIGH (ref 70–99)
Potassium: 4.9 mmol/L (ref 3.5–5.1)
Sodium: 142 mmol/L (ref 135–145)
TOTAL PROTEIN: 7.3 g/dL (ref 6.5–8.1)

## 2018-01-18 LAB — STREP PNEUMONIAE URINARY ANTIGEN: STREP PNEUMO URINARY ANTIGEN: NEGATIVE

## 2018-01-18 LAB — GLUCOSE, CAPILLARY
Glucose-Capillary: 128 mg/dL — ABNORMAL HIGH (ref 70–99)
Glucose-Capillary: 165 mg/dL — ABNORMAL HIGH (ref 70–99)

## 2018-01-18 LAB — HIV ANTIBODY (ROUTINE TESTING W REFLEX): HIV Screen 4th Generation wRfx: NONREACTIVE

## 2018-01-18 MED ORDER — INSULIN ASPART 100 UNIT/ML ~~LOC~~ SOLN: 0.0000 [IU] | Freq: Every day | SUBCUTANEOUS | Status: DC

## 2018-01-18 MED ORDER — ADULT MULTIVITAMIN W/MINERALS CH
1.0000 | ORAL_TABLET | Freq: Every day | ORAL | Status: DC
  Filled 2018-01-18: qty 1

## 2018-01-18 MED ORDER — VITAMIN D3 25 MCG (1000 UNIT) PO TABS
1000.0000 [IU] | ORAL_TABLET | Freq: Every day | ORAL | Status: DC
  Filled 2018-01-18: qty 1

## 2018-01-18 MED ORDER — ENOXAPARIN SODIUM 40 MG/0.4ML ~~LOC~~ SOLN
40.0000 mg | SUBCUTANEOUS | Status: DC
  Filled 2018-01-18: qty 0.4

## 2018-01-18 MED ORDER — METFORMIN HCL 500 MG PO TABS
500.0000 mg | ORAL_TABLET | Freq: Two times a day (BID) | ORAL | Status: DC
  Administered 2018-01-19: 500 mg via ORAL
  Filled 2018-01-18: qty 1

## 2018-01-18 MED ORDER — SODIUM CHLORIDE 0.9 % IV SOLN
INTRAVENOUS | Status: DC
  Administered 2018-01-18: 04:00:00 via INTRAVENOUS

## 2018-01-18 MED ORDER — INSULIN ASPART 100 UNIT/ML ~~LOC~~ SOLN: 0.0000 [IU] | Freq: Three times a day (TID) | SUBCUTANEOUS | Status: DC

## 2018-01-18 MED ORDER — ASPIRIN EC 81 MG PO TBEC
81.0000 mg | DELAYED_RELEASE_TABLET | Freq: Every day | ORAL | Status: DC
  Filled 2018-01-18: qty 1

## 2018-01-18 MED ORDER — VITAMIN B-6 100 MG PO TABS
100.0000 mg | ORAL_TABLET | Freq: Every day | ORAL | Status: DC
  Filled 2018-01-18 (×2): qty 1

## 2018-01-18 MED ORDER — ACETAMINOPHEN 325 MG PO TABS: 650.0000 mg | ORAL_TABLET | Freq: Four times a day (QID) | ORAL | Status: DC | PRN

## 2018-01-18 MED ORDER — GUAIFENESIN ER 600 MG PO TB12: 600.0000 mg | ORAL_TABLET | Freq: Two times a day (BID) | ORAL | Status: DC | PRN

## 2018-01-18 MED ORDER — SODIUM CHLORIDE 0.9 % IV SOLN
500.0000 mg | INTRAVENOUS | Status: DC
  Administered 2018-01-18: 500 mg via INTRAVENOUS
  Filled 2018-01-18 (×2): qty 500

## 2018-01-18 MED ORDER — SODIUM CHLORIDE 0.9 % IV SOLN
1.0000 g | INTRAVENOUS | Status: DC
  Administered 2018-01-18: 1 g via INTRAVENOUS
  Filled 2018-01-18: qty 1
  Filled 2018-01-18: qty 10

## 2018-01-18 NOTE — Progress Notes (Signed)
PROGRESS NOTE    Richard Stevenson  ZOX:[id number] DOB: February 04, 1966 DOA: 01/17/2018 PCP: Patient, No Pcp Per    Brief Narrative:  52 y.o. male with medical history significant of with history of obstructive sleep apnea on CPAP, hypertension, hyperlipidemia who apparently has been using humidifier on his CPAP but has not used it for about a week.  It has dried out.  He normally cleans it with special liquid and has been doing that without any problem.  He started having generalized fever chills cough and shortness of breath about 10 days now.  This has progressed to become more productive of yellow mucus.  Symptoms have gotten worse in the last 3 days patient is having exertional dyspnea.  He came to the ER where he was evaluated.  He was found to have evidence of pneumonia.  Patient was also hypoxic requiring 3 L of oxygen to maintain oxygen sat of 92%.  He denied any sick contact.  Denied any recent travel.  Patient believes his CPAP has something to do with his symptoms.  He has taken Mucinex but no antibiotics in the last 4 days.  He has not been on oxygen and notes smoking history.  No history of asthma.  Patient is therefore being admitted with pneumonia requiring oxygen for full evaluation..  ED Course: Patient's temperature is 100.4, his blood pressure 162/66 pulse 87 respiratory of 21 oxygen sat 81% on room air and 93% on 3 L.  His chemistry appears stable except for creatinine 1.3 and glucose of 166 white count is 8.8 hemoglobin 14.1 platelets 200.  Urinalysis is entirely negative.  Chest x-ray showed left lower lobe bronchopneumonia, CT angiogram of the chest showed no PE but scattered airspace opacities in the left lower lobe suggesting pneumonia  Assessment & Plan:   Active Problems:   Pneumonia   OSA (obstructive sleep apnea)   HTN (hypertension)   Hyperlipidemia  #1 left lower lobe pneumonia:  -Suspected community-acquired pneumonia -Continued on azithromycin and rocephin -Some  clinical improvement noted, although patient remains dependent on O2, with O2 sats down the 80's on room air -Give trial of flutter valve -Wean O2 as tolerated  #2 acute hypoxemia:  -No history of COPD or asthma noted.  Not on home O2.  -Down to [facility]. Still with O2 sats down to the 80's on room air -Continue abx and wean O2 as tolerated  #3 obstructive sleep apnea: Patient has CPAP from home.  Continue CPAP as tolerated.  #4 hypertension: Continue monitoring.   -BP overnight stable -Will continue to monitor  #5 hyperlipidemia:  -Continue home regimen as tolerated.   -Not on any statin.  Dietary control palpation  DVT prophylaxis: Lovenox subQ Code Status: Full Family Communication: Pt in room, family not at bedside Disposition Plan: Uncertain at this time  Consultants:     Procedures:     Antimicrobials: Anti-infectives (From admission, onward)   Start     Dose/Rate Route Frequency Ordered Stop   01/18/18 2000  azithromycin (ZITHROMAX) 500 mg in sodium chloride 0.9 % 250 mL IVPB     500 mg 250 mL/hr over 60 Minutes Intravenous Every 24 hours 01/18/18 0323 01/25/18 1959   01/18/18 1800  cefTRIAXone (ROCEPHIN) 1 g in sodium chloride 0.9 % 100 mL IVPB     1 g 200 mL/hr over 30 Minutes Intravenous Every 24 hours 01/18/18 0323 01/25/18 1759   01/17/18 1800  cefTRIAXone (ROCEPHIN) 1 g in sodium chloride 0.9 % 100 mL IVPB  1 g 200 mL/hr over 30 Minutes Intravenous  Once 01/17/18 1756 01/17/18 1951   01/17/18 1800  azithromycin (ZITHROMAX) 500 mg in sodium chloride 0.9 % 250 mL IVPB     500 mg 250 mL/hr over 60 Minutes Intravenous  Once 01/17/18 1756 01/17/18 2100       Subjective: Reports feeling better, however still sob  Objective: Vitals:   01/18/18 0451 01/18/18 1107 01/18/18 1117 01/18/18 1450  BP: 119/75   (!) 141/75  Pulse: 63   68  Resp: 18   14  Temp: 98.1 F (36.7 C)   98.2 F (36.8 C)  TempSrc:      SpO2: 93% (!) 87% 92% 93%  Weight:        Height:        Intake/Output Summary (Last 24 hours) at 01/18/2018 1651 Last data filed at 01/18/2018 0600 Gross per 24 hour  Intake 2234.77 ml  Output 500 ml  Net 1734.77 ml   Filed Weights   01/17/18 1717  Weight: 117 kg    Examination:  General exam: Appears calm and comfortable  Respiratory system: Clear to auscultation. Respiratory effort normal. Cardiovascular system: S1 & S2 heard, RRR Gastrointestinal system: Abdomen is nondistended, soft and nontender. No organomegaly or masses felt. Normal bowel sounds heard. Central nervous system: Alert and oriented. No focal neurological deficits. Extremities: Symmetric 5 x 5 power. Skin: No rashes, lesions Psychiatry: Judgement and insight appear normal. Mood & affect appropriate.   Data Reviewed: I have personally reviewed following labs and imaging studies  CBC: Recent Labs  Lab 01/17/18 1705  WBC 8.8  NEUTROABS 5.5  HGB 14.1  HCT 39.8  MCV 97.8  PLT 200   Basic Metabolic Panel: Recent Labs  Lab 01/17/18 1705 01/18/18 0344  NA 138 142  K 3.8 4.9  CL 104 107  CO2 26 24  GLUCOSE 166* 231*  BUN 15 14  CREATININE 1.30* 1.20  CALCIUM 9.4 9.4   GFR: Estimated Creatinine Clearance: 92.6 mL/min (by C-G formula based on SCr of 1.2 mg/dL). Liver Function Tests: Recent Labs  Lab 01/18/18 0344  AST 46*  ALT 79*  ALKPHOS 104  BILITOT 0.6  PROT 7.3  ALBUMIN 3.7   No results for input(s): LIPASE, AMYLASE in the last 168 hours. No results for input(s): AMMONIA in the last 168 hours. Coagulation Profile: No results for input(s): INR, PROTIME in the last 168 hours. Cardiac Enzymes: No results for input(s): CKTOTAL, CKMB, CKMBINDEX, TROPONINI in the last 168 hours. BNP (last 3 results) No results for input(s): PROBNP in the last 8760 hours. HbA1C: Recent Labs    01/18/18 0344  HGBA1C 5.6   CBG: No results for input(s): GLUCAP in the last 168 hours. Lipid Profile: No results for input(s): CHOL, HDL,  LDLCALC, TRIG, CHOLHDL, LDLDIRECT in the last 72 hours. Thyroid Function Tests: No results for input(s): TSH, T4TOTAL, FREET4, T3FREE, THYROIDAB in the last 72 hours. Anemia Panel: No results for input(s): VITAMINB12, FOLATE, FERRITIN, TIBC, IRON, RETICCTPCT in the last 72 hours. Sepsis Labs: No results for input(s): PROCALCITON, LATICACIDVEN in the last 168 hours.  No results found for this or any previous visit (from the past 240 hour(s)).   Radiology Studies: Dg Chest 2 View  Result Date: 01/17/2018 CLINICAL DATA:  One-week history of cough, shortness of breath, myalgias, fever and chest wall pain with coughing. Hypoxemia. Wheezing and ronchi on auscultatory examination. EXAM: CHEST - 2 VIEW COMPARISON:  None. FINDINGS: Cardiac silhouette upper normal in  size. Hilar and mediastinal contours unremarkable. Streaky and patchy opacities involving the LEFT LOWER LOBE. Lungs otherwise clear. Pulmonary vascularity normal. No pleural effusions. Visualized bony thorax intact. IMPRESSION: LEFT LOWER LOBE bronchopneumonia. Electronically Signed   By: Hulan Saas M.D.   On: 01/17/2018 17:53   Ct Angio Chest Pe W/cm &/or Wo Cm  Result Date: 01/17/2018 CLINICAL DATA:  shortness of breath, respiratory distress, body aches, fever, chest wall with upon coughing x 1 week 02 sat 81 % RA Placed on 4LNC Wheezing and Rhonchi throughout. 5 mg albuterol NEB and 125 mg solumedrol IVP in routePatient coughed through entire first sequence, repeat bolus administered EXAM: CT ANGIOGRAPHY CHEST WITH CONTRAST TECHNIQUE: Multidetector CT imaging of the chest was performed using the standard protocol during bolus administration of intravenous contrast. Multiplanar CT image reconstructions and MIPs were obtained to evaluate the vascular anatomy. CONTRAST:  ISOVUE-370 IOPAMIDOL (ISOVUE-370) INJECTION 76% COMPARISON:  None. FINDINGS: Cardiovascular: Heart size upper limits normal. No pericardial effusion. Satisfactory  opacification of pulmonary arteries noted, and there is no evidence of pulmonary emboli. Adequate contrast opacification of the thoracic aorta with no evidence of dissection, aneurysm, or stenosis. There is classic 3-vessel brachiocephalic arch anatomy without proximal stenosis. No significant atheromatous plaque. Visualized proximal abdominal aorta unremarkable. Mediastinum/Nodes: Inferior right hilar lymph node 1.3 cm diameter. No other hilar or mediastinal adenopathy. Lungs/Pleura: Scattered Airspace opacities in the basilar segments of the left lower lobe. Right lung clear. No pleural effusion. No pneumothorax. Upper Abdomen: 3 cm soft tissue attenuation left adrenal nodule. No acute findings. Musculoskeletal: No chest wall abnormality. No acute or significant osseous findings. Review of the MIP images confirms the above findings. IMPRESSION: 1. Negative for acute PE or thoracic aortic dissection. 2. Scattered airspace opacities in the left lower lobe suggesting pneumonia. 3. Single enlarged inferior left hilar lymph node, possibly reactive but nonspecific. 4. 3 cm left adrenal nodule, statistically most likely adenoma in the absence of a history of primary carcinoma although the CT findings are not diagnostic. When the patient is clinically stable and able to follow directions and hold their breath (preferably as an outpatient) further evaluation with dedicated abdominal MRI should be considered. Electronically Signed   By: Corlis Leak M.D.   On: 01/17/2018 19:45    Scheduled Meds: . aspirin EC  81 mg Oral Daily  . cholecalciferol  1,000 Units Oral Daily  . enoxaparin (LOVENOX) injection  40 mg Subcutaneous Q24H  . insulin aspart  0-15 Units Subcutaneous TID WC  . insulin aspart  0-5 Units Subcutaneous QHS  . metFORMIN  500 mg Oral BID WC  . multivitamin with minerals  1 tablet Oral Daily  . pyridOXINE  100 mg Oral Daily   Continuous Infusions: . sodium chloride 100 mL/hr at 01/18/18 0336  .  azithromycin    . cefTRIAXone (ROCEPHIN)  IV       LOS: 1 day   Rickey Barbara, MD [facility] Pager On Amion  If 7PM-7AM, please contact night-coverage 01/18/2018, 4:51 PM

## 2018-01-18 NOTE — Progress Notes (Signed)
Pt is not ready for cpap at this time.  Pt will call for assistance.  Rn aware.

## 2018-01-19 DIAGNOSIS — R0902 Hypoxemia: Secondary | ICD-10-CM

## 2018-01-19 DIAGNOSIS — E7841 Elevated Lipoprotein(a): Secondary | ICD-10-CM

## 2018-01-19 LAB — BASIC METABOLIC PANEL
ANION GAP: 7 (ref 5–15)
Anion gap: 9 (ref 5–15)
BUN: 24 mg/dL — AB (ref 6–20)
BUN: 24 mg/dL — ABNORMAL HIGH (ref 6–20)
CHLORIDE: 109 mmol/L (ref 98–111)
CO2: 28 mmol/L (ref 22–32)
CO2: 28 mmol/L (ref 22–32)
CREATININE: 1.23 mg/dL (ref 0.61–1.24)
Calcium: 8.8 mg/dL — ABNORMAL LOW (ref 8.9–10.3)
Calcium: 8.9 mg/dL (ref 8.9–10.3)
Chloride: 112 mmol/L — ABNORMAL HIGH (ref 98–111)
Creatinine, Ser: 1.41 mg/dL — ABNORMAL HIGH (ref 0.61–1.24)
GFR calc Af Amer: >60 mL/min (ref >60)
GFR calc non Af Amer: >60 mL/min (ref >60)
GFR, EST NON AFRICAN AMERICAN: 55 mL/min — AB (ref >60)
Glucose, Bld: 119 mg/dL — ABNORMAL HIGH (ref 70–99)
Glucose, Bld: 98 mg/dL (ref 70–99)
POTASSIUM: 4.6 mmol/L (ref 3.5–5.1)
Potassium: 5 mmol/L (ref 3.5–5.1)
SODIUM: 147 mmol/L — AB (ref 135–145)
Sodium: 146 mmol/L — ABNORMAL HIGH (ref 135–145)

## 2018-01-19 LAB — LIPID PANEL
CHOLESTEROL: 168 mg/dL (ref 0–200)
HDL: 26 mg/dL — ABNORMAL LOW (ref >40)
LDL Cholesterol: 120 mg/dL — ABNORMAL HIGH (ref 0–99)
Total CHOL/HDL Ratio: 6.5 RATIO
Triglycerides: 109 mg/dL (ref <150)
VLDL: 22 mg/dL (ref 0–40)

## 2018-01-19 LAB — GLUCOSE, CAPILLARY
GLUCOSE-CAPILLARY: 111 mg/dL — AB (ref 70–99)
Glucose-Capillary: 81 mg/dL (ref 70–99)

## 2018-01-19 MED ORDER — AZITHROMYCIN 250 MG PO TABS: ORAL_TABLET | ORAL | 0 refills | Status: AC

## 2018-01-19 MED ORDER — CEFDINIR 300 MG PO CAPS: 300.0000 mg | ORAL_CAPSULE | Freq: Two times a day (BID) | ORAL | 0 refills | Status: AC

## 2018-01-19 NOTE — Progress Notes (Signed)
Patient given discharge, medication, and follow up instructions, verbalized understanding, IV and telemetry monitor removed, personal belongings with patient, family to transport home  °

## 2018-01-19 NOTE — Progress Notes (Signed)
SATURATION QUALIFICATIONS: (This note is used to comply with regulatory documentation for home oxygen)  Patient Saturations on Room Air at Rest = 92-96%  Patient Saturations on Room Air while Ambulating = 93%

## 2018-01-19 NOTE — Discharge Summary (Signed)
Physician Discharge Summary  Lanny Lipkin ZOX:[id number] DOB: [birth year] DOA: 01/17/2018  PCP: Patient, No Pcp Per  Admit date: 01/17/2018 Discharge date: 01/19/2018  Admitted From: Home Disposition:  Home  Recommendations for Outpatient Follow-up:  1. Follow up with PCP in 1-2 weeks 2. Recommend follow up with [facility]  Discharge Condition:Improved CODE STATUS:Full Diet recommendation: Regular   Brief/Interim Summary: 52 y.o.malewith medical history significant ofwith history of obstructive sleep apnea on CPAP, hypertension, hyperlipidemia who apparently has been using humidifier on his CPAP but has not used it for about a week. It has dried out. He normally cleans it with special liquid and has been doing that without any problem. He started having generalized fever chills cough and shortness of breath about 10 days now. This has progressed to become more productive of yellow mucus. Symptoms have gotten worse in the last 3 days patient is having exertional dyspnea. He came to the ER where he was evaluated. He was found to have evidence of pneumonia. Patient was also hypoxic requiring 3 L of oxygen to maintain oxygen sat of 92%. He denied any sick contact. Denied any recent travel. Patient believes his CPAP has something to do with his symptoms. He has taken Mucinex but no antibiotics in the last 4 days. He has not been on oxygen and notes smoking history. No history of asthma. Patient is therefore being admitted with pneumonia requiring oxygen for full evaluation..  ED Course:Patient's temperature is 100.4, his blood pressure 162/66 pulse 87 respiratory of 21 oxygen sat 81% on room air and 93% on 3 L. His chemistry appears stable except for creatinine 1.3 and glucose of 166 white count is 8.8 hemoglobin 14.1 platelets 200. Urinalysis is entirely negative. Chest x-ray showed left lower lobe bronchopneumonia, CT angiogram of the chest showed no PE but scattered  airspace opacities in the left lower lobe suggesting pneumonia  #1 left lower lobe pneumonia:  -Suspected community-acquired pneumonia -Improvement on azithromycin and rocephin -Give trial of flutter valve -Weaned O2 to room air  #2 acute hypoxemia:  -No history of COPD or asthma noted. Not on home O2.  -Successfully weaned to room air  #3 obstructive sleep apnea:Patient has CPAP from home. Continue CPAP as tolerated.  #4 hypertension:Continue monitoring.  -BP overnight stable -Recommend referral to outpatient weight management center  #5 hyperlipidemia: -Continue home regimen as tolerated.  -Not on any statin. Dietary control palpation -LDL of 120. Continue diet/lifestyle modification  Discharge Diagnoses:  Active Problems:   Pneumonia   OSA (obstructive sleep apnea)   HTN (hypertension)   Hyperlipidemia    Discharge Instructions   Allergies as of 01/19/2018   No Known Allergies     Medication List    TAKE these medications   aspirin EC 81 MG tablet Take 81 mg by mouth daily.   azithromycin 250 MG tablet Commonly known as:  ZITHROMAX Take one tablet by mouth daily for 5 more days   cefdinir 300 MG capsule Commonly known as:  OMNICEF Take 1 capsule (300 mg total) by mouth 2 (two) times daily for 5 days.   cholecalciferol 1000 units tablet Commonly known as:  VITAMIN D Take 1,000 Units by mouth daily.   guaiFENesin 600 MG 12 hr tablet Commonly known as:  MUCINEX Take 600 mg by mouth 2 (two) times daily as needed for cough or to loosen phlegm.   multivitamin with minerals Tabs tablet Take 1 tablet by mouth daily.   pyridOXINE 100 MG tablet Commonly known  as:  VITAMIN B-6 Take 100 mg by mouth daily.   VITAMIN B-12 CR PO Take 1 tablet by mouth daily.      Follow-up Information    Filbert Schilder, MD. Schedule an appointment as soon as possible for a visit.   Specialty:  Family Medicine Why:  [facility]   Contact information: [address] [employer] [employer] [place] [address] [phone]        Follow up with your PCP in 1-2 weeks. Schedule an appointment as soon as possible for a visit.          No Known Allergies    Procedures/Studies: Dg Chest 2 View  Result Date: 01/17/2018 CLINICAL DATA:  One-week history of cough, shortness of breath, myalgias, fever and chest wall pain with coughing. Hypoxemia. Wheezing and ronchi on auscultatory examination. EXAM: CHEST - 2 VIEW COMPARISON:  None. FINDINGS: Cardiac silhouette upper normal in size. Hilar and mediastinal contours unremarkable. Streaky and patchy opacities involving the LEFT LOWER LOBE. Lungs otherwise clear. Pulmonary vascularity normal. No pleural effusions. Visualized bony thorax intact. IMPRESSION: LEFT LOWER LOBE bronchopneumonia. Electronically Signed   By: Hulan Saas M.D.   On: 01/17/2018 17:53   Ct Angio Chest Pe W/cm &/or Wo Cm  Result Date: 01/17/2018 CLINICAL DATA:  shortness of breath, respiratory distress, body aches, fever, chest wall with upon coughing x 1 week 02 sat 81 % RA Placed on 4LNC Wheezing and Rhonchi throughout. 5 mg albuterol NEB and 125 mg solumedrol IVP in routePatient coughed through entire first sequence, repeat bolus administered EXAM: CT ANGIOGRAPHY CHEST WITH CONTRAST TECHNIQUE: Multidetector CT imaging of the chest was performed using the standard protocol during bolus administration of intravenous contrast. Multiplanar CT image reconstructions and MIPs were obtained to evaluate the vascular anatomy. CONTRAST:  ISOVUE-370 IOPAMIDOL (ISOVUE-370) INJECTION 76% COMPARISON:  None. FINDINGS: Cardiovascular: Heart size upper limits normal. No pericardial effusion. Satisfactory opacification of pulmonary arteries noted, and there is no evidence of pulmonary emboli. Adequate contrast opacification of the thoracic aorta with no evidence of dissection, aneurysm, or stenosis. There is classic 3-vessel  brachiocephalic arch anatomy without proximal stenosis. No significant atheromatous plaque. Visualized proximal abdominal aorta unremarkable. Mediastinum/Nodes: Inferior right hilar lymph node 1.3 cm diameter. No other hilar or mediastinal adenopathy. Lungs/Pleura: Scattered Airspace opacities in the basilar segments of the left lower lobe. Right lung clear. No pleural effusion. No pneumothorax. Upper Abdomen: 3 cm soft tissue attenuation left adrenal nodule. No acute findings. Musculoskeletal: No chest wall abnormality. No acute or significant osseous findings. Review of the MIP images confirms the above findings. IMPRESSION: 1. Negative for acute PE or thoracic aortic dissection. 2. Scattered airspace opacities in the left lower lobe suggesting pneumonia. 3. Single enlarged inferior left hilar lymph node, possibly reactive but nonspecific. 4. 3 cm left adrenal nodule, statistically most likely adenoma in the absence of a history of primary carcinoma although the CT findings are not diagnostic. When the patient is clinically stable and able to follow directions and hold their breath (preferably as an outpatient) further evaluation with dedicated abdominal MRI should be considered. Electronically Signed   By: Corlis Leak M.D.   On: 01/17/2018 19:45     Subjective: Eager to go home  Discharge Exam: Vitals:   01/19/18 0502 01/19/18 0943  BP: 131/82   Pulse: 70   Resp: 18   Temp: 98.8 F (37.1 C)   SpO2: 98% 96%   Vitals:   01/18/18 2031 01/19/18 0111 01/19/18  0502 01/19/18 0943  BP: (!) 140/95  131/82   Pulse: 75 78 70   Resp: (!) 8 16 18    Temp: 98.3 F (36.8 C)  98.8 F (37.1 C)   TempSrc: Oral  Oral   SpO2: 98% 96% 98% 96%  Weight:      Height:        General: Pt is alert, awake, not in acute distress Cardiovascular: RRR, S1/S2 +, no rubs, no gallops Respiratory: CTA bilaterally, no wheezing, no rhonchi Abdominal: Soft, NT, ND, bowel sounds + Extremities: no edema, no  cyanosis   The results of significant diagnostics from this hospitalization (including imaging, microbiology, ancillary and laboratory) are listed below for reference.     Microbiology: Recent Results (from the past 240 hour(s))  Culture, blood (routine x 2) Call MD if unable to obtain prior to antibiotics being given     Status: None (Preliminary result)   Collection Time: 01/18/18  3:44 AM  Result Value Ref Range Status   Specimen Description   Final    BLOOD LEFT ANTECUBITAL Performed at [facility], [address] [place], [place] [address]    Special Requests   Final    BOTTLES DRAWN AEROBIC ONLY Blood Culture adequate volume Performed at [facility], [address], [place], [place] [address]    Culture   Final    NO GROWTH 1 DAY Performed at [facility], [address] [place], [place] [address]    Report Status PENDING  Incomplete  Culture, blood (routine x 2) Call MD if unable to obtain prior to antibiotics being given     Status: None (Preliminary result)   Collection Time: 01/18/18  3:44 AM  Result Value Ref Range Status   Specimen Description   Final    BLOOD BLOOD RIGHT HAND Performed at [facility], [address], [place], [place] [address]    Special Requests   Final    BOTTLES DRAWN AEROBIC ONLY Blood Culture adequate volume Performed at [facility], [address], [place], [place] [address]    Culture   Final    NO GROWTH 1 DAY Performed at [facility], [address], [place], [place] [address]    Report Status PENDING  Incomplete     Labs: BNP (last 3 results) No results for input(s): BNP in the last 8760 hours. Basic Metabolic Panel: Recent Labs  Lab 01/17/18 1705 01/18/18 0344 01/19/18 0436 01/19/18 1250  NA 138 142 147* 146*  K 3.8 4.9 5.0 4.6  CL 104 107 112* 109  CO2 26 24 28 28   GLUCOSE 166* 231* 119* 98  BUN 15 14 24* 24*  CREATININE  1.30* 1.20 1.41* 1.23  CALCIUM 9.4 9.4 8.8* 8.9   Liver Function Tests: Recent Labs  Lab 01/18/18 0344  AST 46*  ALT 79*  ALKPHOS 104  BILITOT 0.6  PROT 7.3  ALBUMIN 3.7   No results for input(s): LIPASE, AMYLASE in the last 168 hours. No results for input(s): AMMONIA in the last 168 hours. CBC: Recent Labs  Lab 01/17/18 1705  WBC 8.8  NEUTROABS 5.5  HGB 14.1  HCT 39.8  MCV 97.8  PLT 200   Cardiac Enzymes: No results for input(s): CKTOTAL, CKMB, CKMBINDEX, TROPONINI in the last 168 hours. BNP: Invalid input(s): POCBNP CBG: Recent Labs  Lab 01/18/18 1717 01/18/18 2233 01/19/18 0754 01/19/18 1135  GLUCAP 128* 165* 111* 81   D-Dimer  No results for input(s): DDIMER in the last 72 hours. Hgb A1c Recent Labs    01/18/18 0344  HGBA1C 5.6   Lipid Profile Recent Labs    01/19/18 0436  CHOL 168  HDL 26*  LDLCALC 120*  TRIG 109  CHOLHDL 6.5   Thyroid function studies No results for input(s): TSH, T4TOTAL, T3FREE, THYROIDAB in the last 72 hours.  Invalid input(s): FREET3 Anemia work up No results for input(s): VITAMINB12, FOLATE, FERRITIN, TIBC, IRON, RETICCTPCT in the last 72 hours. Urinalysis    Component Value Date/Time   COLORURINE YELLOW 01/17/2018 1705   APPEARANCEUR CLEAR 01/17/2018 1705   LABSPEC 1.006 01/17/2018 1705   PHURINE 6.0 01/17/2018 1705   GLUCOSEU NEGATIVE 01/17/2018 1705   HGBUR NEGATIVE 01/17/2018 1705   BILIRUBINUR NEGATIVE 01/17/2018 1705   KETONESUR NEGATIVE 01/17/2018 1705   PROTEINUR NEGATIVE 01/17/2018 1705   NITRITE NEGATIVE 01/17/2018 1705   LEUKOCYTESUR NEGATIVE 01/17/2018 1705   Sepsis Labs Invalid input(s): PROCALCITONIN,  WBC,  LACTICIDVEN Microbiology Recent Results (from the past 240 hour(s))  Culture, blood (routine x 2) Call MD if unable to obtain prior to antibiotics being given     Status: None (Preliminary result)   Collection Time: 01/18/18  3:44 AM  Result Value Ref Range Status   Specimen Description    Final    BLOOD LEFT ANTECUBITAL Performed at [facility], [address], [place], [place] [address]    Special Requests   Final    BOTTLES DRAWN AEROBIC ONLY Blood Culture adequate volume Performed at [facility], [address] [place], [place] [address]    Culture   Final    NO GROWTH 1 DAY Performed at [facility], [address] [place], [place] [address]    Report Status PENDING  Incomplete  Culture, blood (routine x 2) Call MD if unable to obtain prior to antibiotics being given     Status: None (Preliminary result)   Collection Time: 01/18/18  3:44 AM  Result Value Ref Range Status   Specimen Description   Final    BLOOD BLOOD RIGHT HAND Performed at [facility], [address], [place], [place] [address]    Special Requests   Final    BOTTLES DRAWN AEROBIC ONLY Blood Culture adequate volume Performed at [facility], [address], [place], [place] [address]    Culture   Final    NO GROWTH 1 DAY Performed at [facility], [address], [place], [place] [address]    Report Status PENDING  Incomplete   Time spent:  SIGNED:   Rickey Barbara, MD  [facility] 01/19/2018, 1:30 PM  If 7PM-7AM, please contact night-coverage

## 2018-01-20 LAB — LEGIONELLA PNEUMOPHILA SEROGP 1 UR AG: L. PNEUMOPHILA SEROGP 1 UR AG: NEGATIVE

## 2018-01-23 LAB — CULTURE, BLOOD (ROUTINE X 2)
Culture: NO GROWTH
Culture: NO GROWTH
Special Requests: ADEQUATE
Special Requests: ADEQUATE

## 2019-04-14 IMAGING — CR DG CHEST 2V
2 series · 2 of 2 positions shown · non-contrast
Comparison: None.

CLINICAL DATA: One-week history of cough, shortness of breath,
myalgias, fever and chest wall pain with coughing. Hypoxemia.
Wheezing and ronchi on auscultatory examination.

EXAM:
CHEST - 2 VIEW

[w chest pa]
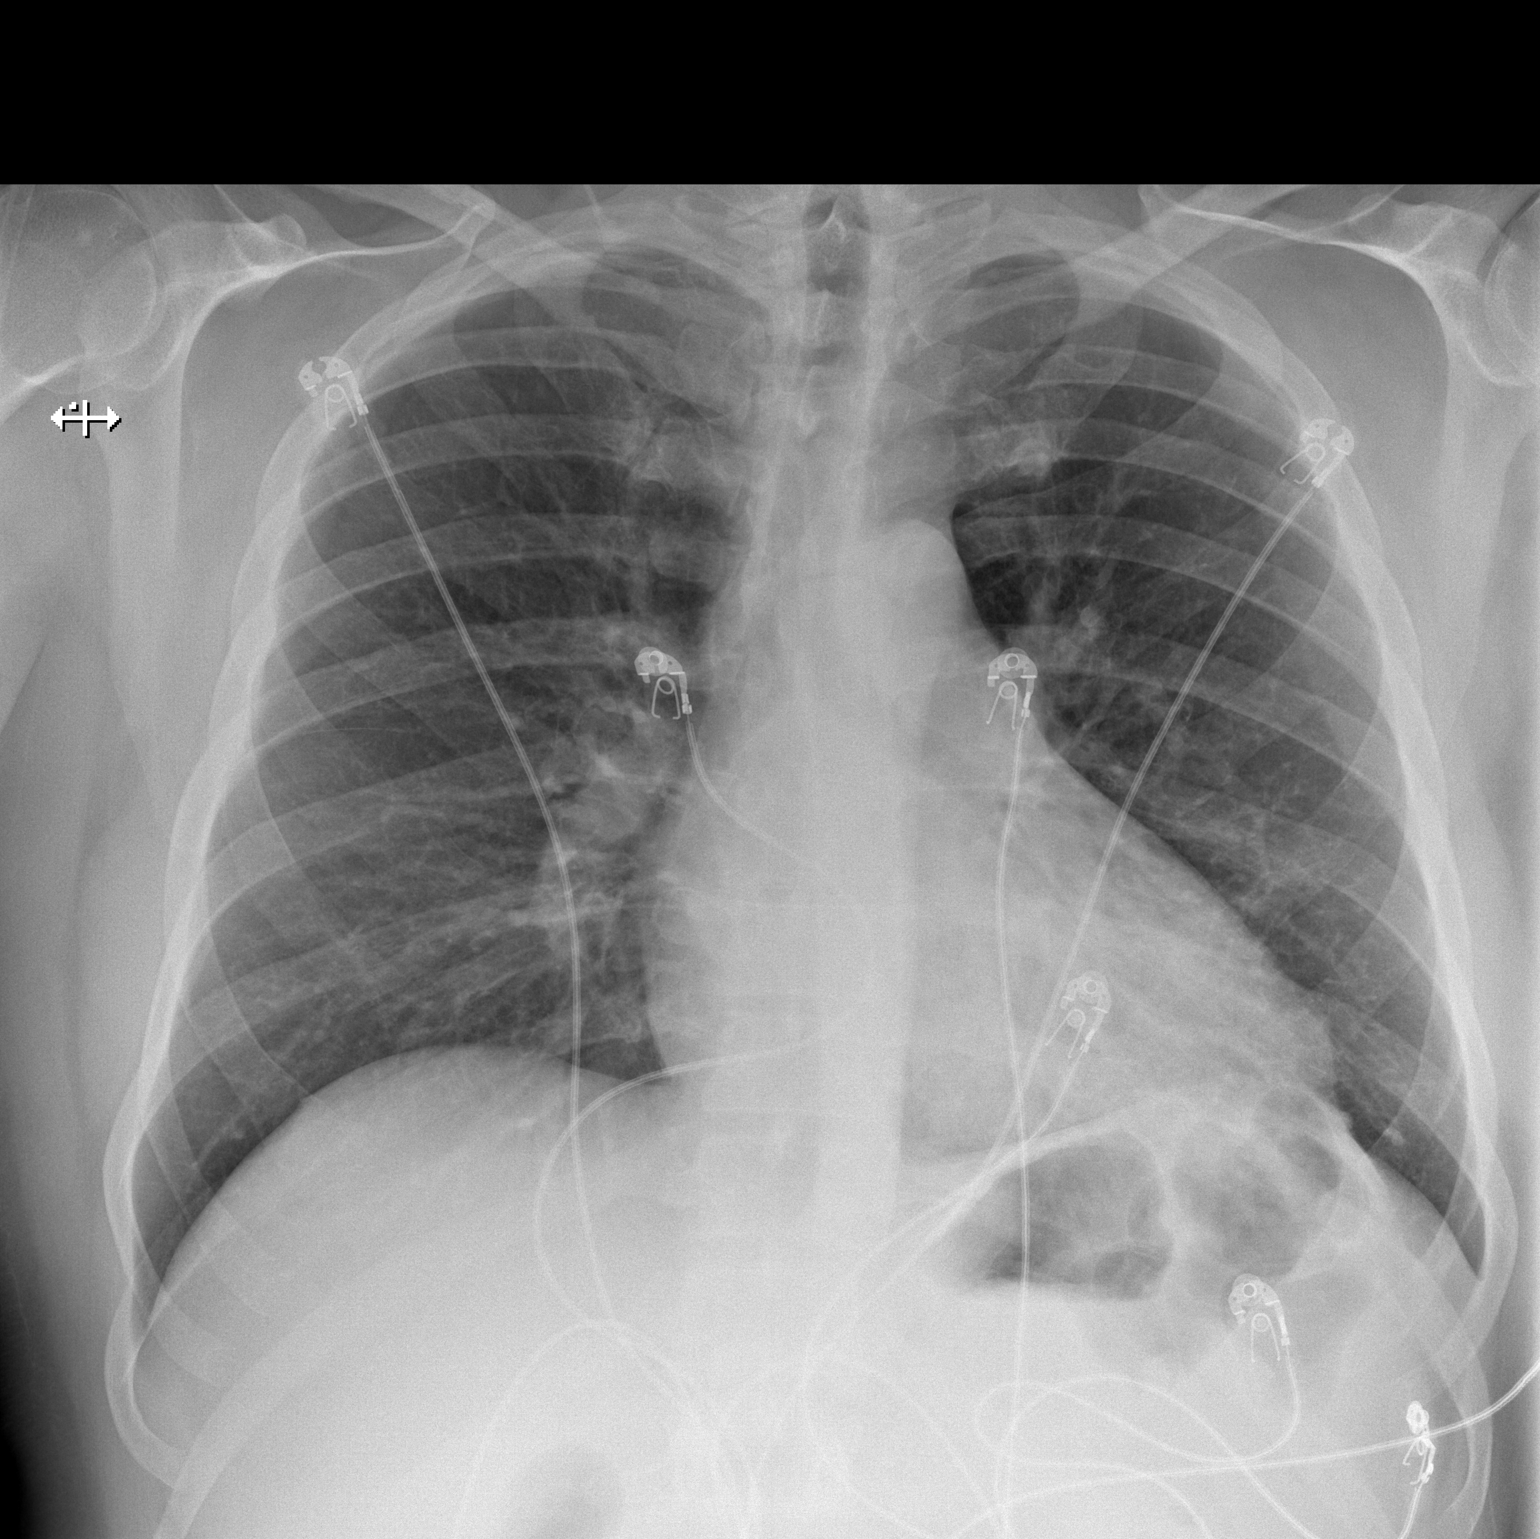

[w chest lat]
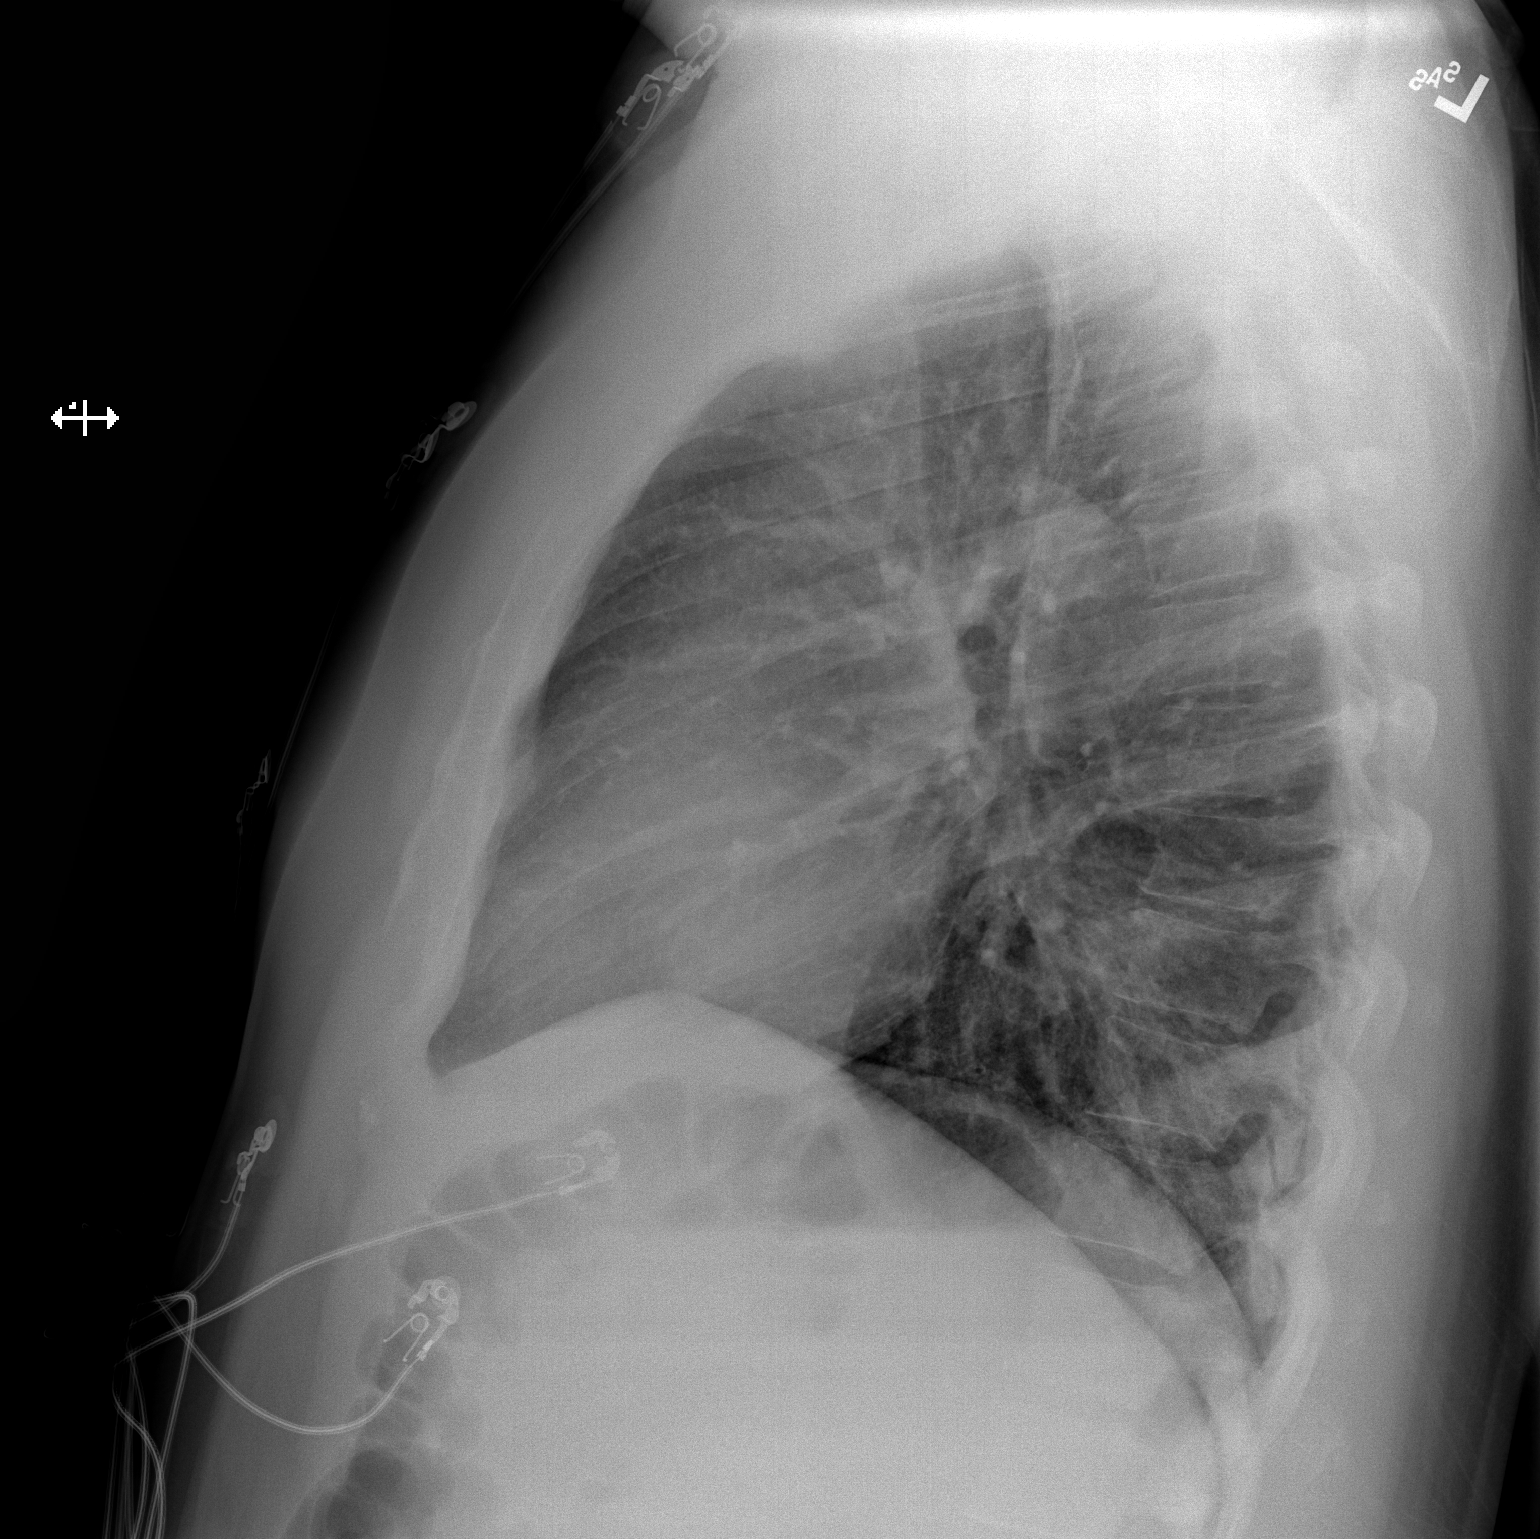

[2 of 2 positions shown; findings below may reference images not displayed]

FINDINGS: Cardiac silhouette upper normal in size. Hilar and mediastinal
contours unremarkable. Streaky and patchy opacities involving the
LEFT LOWER LOBE. Lungs otherwise clear. Pulmonary vascularity
normal. No pleural effusions. Visualized bony thorax intact.
IMPRESSION: LEFT LOWER LOBE bronchopneumonia.

## 2019-12-21 ENCOUNTER — Inpatient Hospital Stay: Admit: 2020-01-13 | Payer: PRIVATE HEALTH INSURANCE | Primary: Person

## 2019-12-24 ENCOUNTER — Inpatient Hospital Stay: Admit: 2020-01-13 | Payer: PRIVATE HEALTH INSURANCE | Primary: Person

## 2019-12-26 ENCOUNTER — Inpatient Hospital Stay: Admit: 2020-01-13 | Payer: PRIVATE HEALTH INSURANCE | Primary: Person

## 2020-01-06 ENCOUNTER — Inpatient Hospital Stay: Admit: 2020-01-12 | Payer: PRIVATE HEALTH INSURANCE | Attending: Physician | Primary: Person

## 2020-01-12 ENCOUNTER — Ambulatory Visit: Admit: 2020-01-12 | Payer: PRIVATE HEALTH INSURANCE | Attending: Physician | Primary: Person

## 2020-01-12 DIAGNOSIS — C719 Malignant neoplasm of brain, unspecified: Secondary | ICD-10-CM

## 2020-01-12 MED ORDER — LORAZEPAM 0.5 MG TABLET: 0.5 mg | ORAL | Status: AC

## 2020-01-12 MED ORDER — LEVETIRACETAM 500 MG TABLET: 500 mg | ORAL | Status: DC

## 2020-01-12 MED ORDER — DEXAMETHASONE 4 MG TABLET: 4 mg | ORAL | Status: DC

## 2020-01-12 MED ORDER — TEMOZOLOMIDE 180 MG CAPSULE: 180 mg | ORAL | Status: DC

## 2020-01-12 MED ORDER — ONDANSETRON HCL 8 MG TABLET: 8 mg | ORAL | Status: DC | PRN

## 2020-01-12 MED ORDER — ACETAMINOPHEN 500 MG TABLET: 500 mg | ORAL | Status: DC | PRN

## 2020-01-12 NOTE — Telephone Encounter (Signed)
Reviewed/confirmed medication with patient.

## 2020-01-12 NOTE — Patient Instructions (Signed)
Preparing for Surgery at [facility]                                Your surgeon has recommended that you have an operation.  The following instructions are designed to take you step-by-step through the process and to answer some frequently asked questions.    When am I having surgery and where do I go?  Your surgeon's office will provide you with the location, date, and time for your operation.  If you do not hear from your surgeon's office and want to check on the status of your upcoming procedure, please call the surgeon's office and ask to speak with his/her practice assistant.     Will I meet my anesthesiologist before surgery?  You will not meet the anesthesiologist assigned to take care of you until the day of surgery.  However, you will be assessed by one of [facility] nurse practitioners prior to your operation.  For some patients, this assessment will take place over the phone.  For other patients, an in-person evaluation will be required in our PREPARE clinic.      What do I need to do now?    You will receive a call from [facility] 7-10 days before your surgery to set up either a phone consult or in-person [facility]ARE clinic appointment.  If you do not receive a call from the PREPARE clinic, please call your surgeon's office to confirm that your operation has been scheduled and the referral to PRE[facility]ARE made.      Do I need to have any tests done prior to surgery?  Your surgeon may require laboratory or other diagnostics tests prior to surgery.  These are printed in your After Visit Summary from your surgery clinic visit, and your surgeon may have also given you printed requisition forms.    If you are seen in-person in the [facility]ARE Clinic, your tests will be performed during your [facility] appointment.    If you are evaluated by phone, a member of the [facility]ARE team will give you explicit instructions on when and where to have these studies done.      For many operations, laboratory tests, EKGs  and chest x-rays are not needed.      If you are a Jehovah's Witness, you must notify your surgeon in advance of your procedure. In addition, you MUST discuss your desires regarding transfusion of blood products with your surgeon or the Nurse Practitioner from the [facility]ARE Clinic.  If you have a bleeding disorder such as von Willebrand's disease or hemophilia, notify your surgeon in advance as special arrangements may need to be made in preparation for your surgery (for example, referral to a hematologist).    Will [facility] review my health information from other places?  In order to plan for a smooth operation and recovery, we frequently need to review records from your other doctors.  If you have had any of the studies listed below, please arrange to have the reports faxed to the PREPARE clinic.  Delays in obtaining these records may lead to a delay in your upcoming procedure.    Please Fax the following records to [facility] at [phone]:  (If any of the following were done at [facility], you do not need to provide these records)  1.  Recent notes from your primary care provider  2.  Recent blood work (within 6 months)  3.  Stress test  4.    Echocardiogram (Echo)   5.  Electrocardiogram (EKG)  6   Cardiac catheterization   7   Pacemaker or ICD (Implantable cardioverter-defibrillator)   8.  Clinic notes from any specialist who has evaluated you in the past 2 years (for example a cardiologist, pulmonologist, hematologist)    Which medications should I stop or start prior to surgery?  Our [facility] nurse practitioner will give you detailed instructions on how to manage your medications prior to surgery.     Where do I go on the day of surgery?  And when?  [facility] will give you instructions on where to go on the day of surgery.  Your surgeon's office will tell you when to arrive on the day of surgery.      Where is the PREPARE clinic?  There are two PREPARE clinics, one at [place] and one at [place].  If you are coming in  for an in-person appointment, [facility] will tell you which clinic to go to.  Directions are below:    Directions to [place] [facility]  The [place] PREPARE clinic is located on the first floor of [facility], [address], [address], in [place].  Public parking at [facility] is available in the [place] [address] at [address]   [facility] is accessible via [employer] streetcar line [address], which stops at [address] and [address], and the following [employer] bus lines, which stop in front of the hospital: [address], [address], [address]    Directions to [facility] PRE[facility]  The [place] PREPARE clinic is located on the third floor of the [facility], [address], [address] in [place]. Public parking for [facility] is available in the parking garage at [address].  For public transportation the [facility] is served by the [employer] [address] line, which stops at the [place] [facility] located on [address] near [address]. A new 55 [employer] line will provide service from the [address] BART station to the [place] hospitals.  For more information on Parking and transportation please call [phone]    Does my surgeon have any special instructions for me?  If your surgeon has specific instructions, they are listed below.

## 2020-01-12 NOTE — Progress Notes (Signed)
This is a shared service.     This is a shared visit for services provided by me, Norva Karvonen, MD. I performed a face-to-face encounter with the patient and the following portion of the note is my own.    Subjective    Terry [name] is a 54 y.o. male who presents with the following:  GBM evaluation    History of Present Illness   54 year old male presenting with two months of right hand fine motor skill difficulty and intermittent dysasthesias in right hand, with acute onset of right facial paresthesia and numbness in right hand/forearm since 12/21/19. Imaging revealed two masses in left frontal lobe without midline shift or mass effect. He underwent left frontal excisional mass biopsy with Dr. Eulis Canner on 12/25/19. 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 He states he is still having some mild clumsiness and some spatial sensitive issues in his right arm.    Medications-  Keppra 528m BID  Dexmethasone 6 mg BID  lorazapam prn  Trazadone prn    PMH- sleep apnea    PSH- left frontal brain biopsy using stealth navigation        Social History     Tobacco Use    Smoking status: Not on file   Substance and Sexual Activity    Alcohol use: Not on file    Drug use: Not on file    Sexual activity: Not on file   Social History Narrative    Not on file           Objective        A full physical exam is deferred due to the fact that this is a video visit and we are unable to evaluate in person.  Physical Exam  Neurological:      Mental Status: He is alert and oriented to person, place, and time.      GCS: GCS eye subscore is 4. GCS verbal subscore is 5. GCS motor subscore is 6.   Psychiatric:         Attention and Perception: Attention normal.         Mood and Affect: Mood normal.         Speech: Speech normal.         Behavior: Behavior normal.         Thought Content: Thought content normal.         Cognition and Memory: Cognition normal.         Judgment: Judgment normal.      Comments:  Motor exam of hands appears normal on zoom visualization.       Review of Prior Testing  Impression  MRI Brain 8.3.2021. Enhancing left posterior frontal mass measuring approximately 21 mm, not significantly changed in size from the comparison MRI performed 12/21/2019. This lesion now demonstrates central nonenhancement, suggesting necrosis.   2. Postsurgical changes related to left frontal craniotomy and left anterior frontal lobe mass biopsy, with associated T1 hyperintense blood products in the biopsy cavity.   3. Enhancing left posterior frontal lesion closely abuts the left finger motor activation area along its medial margin. In addition this enhancing lesion is located approximately 8 mm from the area of motor activation with tongue tapping.   4. Left-sided dominant language activation. The T1 hyperintense biopsy cavity within the left anterior frontal lobe is located approximately 11 mm from Broca's area. The enhancing lesion within the left posterior frontal lobe is located approximately 8 mm from the lateral premotor  cortex (Exner's area).   5. Biopsy cavity and enhancing left posterior frontal lesion are located remotely from the corticospinal tracts and areas of visualize activation.       Electronically signed by: Arlyss Repress, MD on 01/06/2020 3:13 PM at workstation [phone]  Narrative      PROCEDURE: FUNCTIONAL MRI OF THE BRAIN WITH AND WITHOUT CONTRAST - STEALTH       INDICATIONS: Other specified disorders of brain       COMPARISON: CT head performed 12/26/2019. MRI brain performed 12/21/2019.       TECHNIQUE:   1. Axial BRAVO 3D, STEALTH (withand without contrast)   2. Verb Generation Task   3. Word Generation Task   4. Sentence Completion Task   5. Visual Function Task   6. Motor Function Task (Finger Tapping, Toe Tapping, and Tongue Tapping)   7. Diffusion Tensor Imaging with tractography   8. IVContrast       Complete instructions were given to the patient prior to examination. All tasks  were selected by a radiologist and administered with a radiologist available throughout the entire procedure. Following the examination, all images were processed on an independent workstation using the DynaSuite Neuro per established protocol. Postprocessing included field map correction, motion correction, coregistration of images with anatomic MRI, and thresholding for significance of activation for blockdesigns.       CONTRAST: 10 mL mL of GADOBUTROL 10 MMOL/10 ML (1 MMOL/ML) INTRAVENOUS SOLUTION gadolinium contrast was administered for tumor evaluation.       FINDINGS:       ANATOMIC PORTION OF EXAM   Postsurgical changes related to left frontal craniotomy and mass biopsy are again noted. T1 hyperintense blood products within the biopsy cavity measuring 18 x 14 mm (series 4, image 135). Mild dural thickening and enhancement are noted underlying the craniotomy site. Rim-enhancing mass lesion located more posteriorly within the left frontal lobe measures approximately 21 x 13 mm (series 12, image 145). This lesion appears similar in size to the comparison study performed 12/21/2019, however now demonstrates central nonenhancement suggestive of necrosis. Surrounding confluent T1 hyperintensity appears similar. No new foci of abnormal parenchymal enhancement are identified.       FUNCTIONAL PORTION OF EXAM       LANGUAGE: Expressive speech (AKA Broca's area) is predominantly located within the left inferior frontal gyrus. Receptive speech (AKA Wernicke's area) is predominantly located within the left posterior superior temporal gyrus. The lateral premotor cortex of (AKA Exner's area) is activated on the left. There is appropriate activation of the language supplemental motor area. The T1 hyperintense blood products within the biopsy cavity are located approximately 11 mm from Broca's area (series 617, image 31). The lateral premotor cortex is located approximately 18 mm from the biopsy cavity and approximately 8  mm from the more posterior enhancing lesion (series 617, image 29).       VISUAL: There is appropriate activation in the bilateral occipital lobes. The lesion is located distant from the visual areas of activation.       MOTOR:       Appropriate activation is present in the bilateral finger sensorimotor and premotor cortices. The more posterior left frontal lesion closely abuts the left finger motor activation area along its medial margin (series 602, image 118).       There is appropriate activationwithin the bilateral facial sensorimotor and premotor cortices with tongue tapping. The left posterior frontal enhancing lesion is located approximately 8 mm posterior to the area of  activation, while the biopsy cavity is located approximately 16 mm anterior (series 620, image 32).       Appropriate activation is noted within the bilateral parasagittal frontal premotor cortices associated with the toe tapping activity. The enhancing lesion and biopsy cavity are located remote from the toe activation area.       DTI: There is no significant deviation or disruption of the left corticospinal tracts due to the lesion. The enhancing left posterior frontal lesion is located approximately 14 mm from the left corticospinal tracts. The T1 hyperintense biopsy cavity is located approximately 19 mm from the corticospinal tracts.  Other Result Text    Interface, Radiology Results In - 01/06/2020 3:15 PM PDT   Formatting of this note might be different from the original.     PROCEDURE: FUNCTIONAL MRI OF THE BRAIN WITH AND WITHOUT CONTRAST - STEALTH       INDICATIONS: Other specified disorders of brain       COMPARISON: CT head performed 12/26/2019. MRI brain performed 12/21/2019.       TECHNIQUE:   1. Axial BRAVO 3D, STEALTH (with and without contrast)   2. Verb Generation Task   3. Word Generation Task   4. Sentence Completion Task   5. Visual Function Task   6. Motor Function Task (Finger Tapping, Toe Tapping, and Tongue Tapping)   7.  Diffusion Tensor Imaging with tractography   8. IV Contrast       Complete instructions were given to the patient prior to examination. All tasks were selected by a radiologist and administered with a radiologist available throughout the entire procedure. Following the examination, all images were processed on an independent workstation using the DynaSuite Neuro per established protocol. Postprocessing included field map correction, motion correction, coregistration of images with anatomic MRI, and thresholding for significance of activation for block designs.       CONTRAST: 10 mL mL of GADOBUTROL 10 MMOL/10 ML (1 MMOL/ML) INTRAVENOUS SOLUTION gadolinium contrast was administered for tumor evaluation.       FINDINGS:       ANATOMIC PORTION OF EXAM   Postsurgical changes related to left frontal craniotomy and mass biopsy are again noted. T1 hyperintense blood products within the biopsy cavity measuring 18 x 14 mm (series 4, image 135). Mild dural thickening and enhancement are noted underlying the craniotomy site. Rim-enhancing mass lesion located more posteriorly within the left frontal lobe measures approximately 21 x 13 mm (series 12, image 145). This lesion appears similar in size to the comparison study performed 12/21/2019, however now demonstrates central nonenhancement suggestive of necrosis. Surrounding confluent T1 hyperintensity appears similar. No new foci of abnormal parenchymal enhancement are identified.       FUNCTIONAL PORTION OF EXAM       LANGUAGE: Expressive speech (AKA Broca's area) is predominantly located within the left inferior frontal gyrus. Receptive speech (AKA Wernicke's area) is predominantly located within the left posterior superior temporal gyrus. The lateral premotor cortex of (AKA Exner's area) is activated on the left. There is appropriate activation of the language supplemental motor area. The T1 hyperintense blood products within the biopsy cavity are located approximately 11 mm  from Broca's area(series 617, image 31). The lateral premotor cortex is located approximately 18 mm from the biopsy cavity and approximately 8 mm from the more posterior enhancing lesion (series 617, image 29).       VISUAL: There is appropriate activation in the bilateral occipital lobes. The lesion is located distant from the  visual areas of activation.       MOTOR:       Appropriate activation is present in the bilateral finger sensorimotor and premotor cortices. The more posterior left frontal lesion closely abuts the left finger motor activation area along its medial margin (series 602, image 118).       There is appropriate activation within the bilateral facial sensorimotor and premotor cortices with tongue tapping. The left posterior frontal enhancing lesion is located approximately 8 mm posterior to the area of activation, while the biopsy cavity is located approximately 16 mm anterior (series 620, image 32).       Appropriate activation is noted within the bilateral parasagittal frontal premotor cortices associated with the toe tapping activity. The enhancing lesion and biopsy cavity are located remote from the toe activation area.       DTI: There is no significant deviation or disruption of the left corticospinal tracts due to the lesion. The enhancing left posterior frontal lesion is located approximately 14 mm from the left corticospinal tracts. The T1 hyperintense biopsy cavity is located approximately 19 mm from the corticospinal tracts.       IMPRESSION:   1. Enhancing left posterior frontal mass measuring approximately 21 mm, not significantly changed in size from the comparison MRI performed 12/21/2019. This lesion now demonstrates central nonenhancement, suggesting necrosis.   2. Postsurgical changes related to left frontal craniotomy and left anterior frontal lobe mass biopsy, with associated T1 hyperintense blood products in the biopsy cavity.   3. Enhancing left posterior frontal lesion  closely abuts the left finger motor activation area along its medial margin. In addition this enhancing lesion is located approximately 8 mm from the area of motor activation with tongue tapping.   4. Left-sided dominant language activation. The T1 hyperintense biopsy cavity within the left anterior frontal lobe is located approximately 11 mm from Broca's area. The enhancing lesion within the left posterior frontal lobe is located approximately 8 mm from the lateral premotor cortex (Exner's area).   5. Biopsy cavity and enhancing left posterior frontal lesion are located remotely from the corticospinal tracts andareas of visualize activation.       Electronically signed by: Arlyss Repress, MD on 01/06/2020 3:13 PM at workstation [phone]     Date: 01/06/20   Received From: [facility] REPORT   ** Supplemental **     Patient Name: Terry [name], Terry [name] Accession #: [id number]     Patient Name: Terry [name], Terry [name] [facility] Page 2 of 2 Surgical Pathology Report   Medical Record #: [id number] Visit #: [id number] Location: [place]   Date of Birth: [birth year] Age: 47 Service Date: 12/25/2019 1:10:10 PM   Gender: M Received: 12/27/2019   Physician(s): Wiliam Ke, MD   Nyra Capes, MD   Marina Goodell, MD       Tissue: Left frontal parietal brain.   Clinical Data: Brain mass.     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   IDH-wildtype by immunohistochemistry.     Comment:   The results of molecular studies for MGMT promoter methylation will be issued in a supplemental report. Dr. Loletha Grayer. Younger reviewed this case and concurs.     Gross Description:    Received labeled "[place]" and "#1" for intraoperative consultation are fragments of rubbery, gray-white, focally hemorrhagic tissue.Representative sections submitted for frozen section. Squash preps are also  prepared. Frozen section diagnosis: glial   neoplasm, favor at  least grade III, final diagnosis pending permanent sections (bbb/[facility]). Sections: FS) Remainder of frozen section tissue, 2) Remainder of tissue.   TV/RJA:cdm     Microscopic Description:   The frozen section and H&E-stained slides show similar-appearing findings. There is a hypercellular pleomorphic glial proliferation present. Mitotic activity is seen. In addition, increased vascularity with focal areas of palisading necrosis are present.   No obvious lower-grade glial proliferation is appreciated.     Immunohistochemical Results:   The following immunohistochemical studies were performed on formalin-fixed,paraffin-embedded tissue sections. Block A was used. Control slides reacted appropriately.   Antibody  Result   GFAP:  Positive in lesional cells, confirming glial origin   IDH-1:  Negative (wild type pattern)   Ki-67:  Highlights up to ~40% in lesional areas     Note: This immunohistochemical test was developed and its performance characteristics determined by [facility], [place], [place] [id number]. It has not been cleared or approved by the U.S. Food and Drug   Administration. The FDA has determined that such clearance or approval is not necessary.   BBB:lms/cdm     1Frozen section performed and interpreted at [facility], [address] [place] [address]     Final Diagnosisperformed by Estill Dooms. Gaspar Bidding, M.D. Electronically signed 12/28/2019     SUPPLEMENTAL REPORT:   OUTSIDE TESTING FROM NEOGENOMICS LABORATORIES     Molecular Genetics   MGMT Gene Promoter Methylation Analysis     Results:   Test   Result   MGMT Gene Promoter Methylation Not Detected     Clinical Significance:   The MGMT (O6 methylguanine-DNA methyltransferase) Gene Promoter Methylation plays crucial role in carcinogenesis. The MGMT protein protects the genome and prevents errors and mismatch during DNA replication and synthesis. Silencing the MGMT gene  via   promoter methylation increases the efficacy of alkylating agents and radiation when used to treat this tumor. Therefore the demonstration of MGMT promoter methylation in a tumor is an indicator of betterresponse and better outcome when the patient is   treated with alkylating agents or radiation. This is particularly true for brain tumors (gliomas), but MGMT promoter methylation has been reported in multiple other tumors including colon, lung, and lymphoma. In general, alkylating agents and/or   radiation therapy are indicated when MGMT methylation is detected in a cancer. However, not all patients whose tumors have MGMT promoter methylation will respond to alkylating chemotherapy.     **ENTIRE REPORT ON FILE AND AVAILABLE UPON REQUEST**   Electronic Signature   Marc Morgans, MD, PhD, Pathologist   The Technical Component Processing, Analysis and Professional Component of this test was completed at [facility], [place], [place], [place] / [address] / [phone] / CLIA #[id number] / Medical Director(s): Latricia Heft, M.D..     The undersigned pathologist has reviewed and acknowledged receipt of the above consultant's report. This represents a transcribed copy and is not intended to replace the original consultant's report.   T: 01/10/2020: dlm       Addendum #1 performed by Leroy Sea B. Gaspar Bidding, M.D. Electronically signed 01/10/2020   Procedure Note    Interface, Lab Results In - 01/10/2020 5:22 PM PDT   Formatting of this note is different from the original.   [employer], [facility]   Towanda Malkin. Allred, MD   Brad B. Gaspar Bidding, [place], DO [address], [place]   [place]: [phone], FAX [phone]   [email or web address]   CLIA# [id number] Betsey Holiday. Tilford Pillar, MD   Helane Rima. Nicole Kindred, MD   Maalaea Younger, MD     SURGICAL PATHOLOGY REPORT   ** Supplemental **     Patient Name: Terry [name], Terry [name] Accession#: [id number]     Patient Name:  Terry [name], Terry [name] [facility] Page 2 of 2 Surgical Pathology Report   Medical Record #: [id number] Visit #: [id number] Location: [place]   Date of Birth: Jul 15, 1965 Age: 30 Service Date: 12/25/2019 1:10:10 PM   Gender: M Received:12/27/2019   Physician(s): Wiliam Ke, MD   Nyra Capes, MD   Marina Goodell, MD       Tissue: Left frontal parietal brain.   Clinical Data: Brain mass.     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   IDH-wildtype by immunohistochemistry.     Comment:   The results of molecular studies for MGMT promoter methylation will be issued in a supplemental report. Dr. Loletha Grayer. Younger reviewed this case and concurs.     Gross Description:    Received labeled "[place]" and "#1" for intraoperative consultation are fragments of rubbery, gray-white, focally hemorrhagic tissue. Representative sections submitted for frozen section. Squash preps are also prepared. Frozen section diagnosis: glial neoplasm, favor at leastgrade III, final diagnosis pending permanent sections (bbb/[facility]). Sections: FS) Remainder of frozen section tissue, 2) Remainder of tissue.   TV/RJA:cdm     Microscopic Description:   The frozen section and H&E-stained slides show similar-appearing findings. There is a hypercellular pleomorphic glial proliferation present. Mitotic activity is seen. In addition, increased vascularity with focal areas of palisading necrosis are present. No obvious lower-grade glial proliferation is appreciated.     Immunohistochemical Results:   The following immunohistochemical studies were performed on formalin-fixed, paraffin-embedded tissue sections. Block A was used. Control slides reacted appropriately.   Antibody  Result   GFAP:  Positive in lesional cells, confirming glial origin   IDH-1:  Negative (wild type pattern)   Ki-67:  Highlights up to ~40% in lesional areas     Note: This immunohistochemical test was developed and its  performance characteristics determined by [facility], [place], [place] [id number]. It has not been cleared or approved by the U.S. Food and Drug Administration. The FDA has determined that such clearance or approval is not necessary.   BBB:lms/cdm     1Frozen section performed and interpreted at [facility], [address] [place] [address]     Final Diagnosis performed by Brad B. Gaspar Bidding, M.D. Electronically signed 12/28/2019     SUPPLEMENTAL REPORT:   OUTSIDE TESTING FROM NEOGENOMICS LABORATORIES     Molecular Genetics   MGMT Gene Promoter Methylation Analysis     Results:   Test   Result   MGMT Gene Promoter Methylation Not Detected     Clinical Significance:   The MGMT (O6 methylguanine-DNA methyltransferase) Gene Promoter Methylation plays crucial role incarcinogenesis. The MGMT protein protects the genome and prevents errors and mismatch during DNA replication and synthesis. Silencing the MGMT gene via promoter methylation increases the efficacy of alkylating agents and radiation when used to treat this tumor. Therefore the demonstration of MGMT promoter methylation in a tumor is an indicator of better response and better outcome when the patient is treated with alkylating agents or radiation. This is particularly true  for brain tumors (gliomas), but MGMT promoter methylation has been reported in multiple other tumors including colon, lung, and lymphoma. In general, alkylating agents and/or radiation therapy are indicated when MGMT methylation is detected in a cancer. However, not all patients whose tumors have MGMT promoter methylation will respond to alkylating chemotherapy.     **ENTIRE REPORT ON FILE AND AVAILABLE UPON REQUEST**   Electronic Signature   Marc Morgans, MD, PhD, Pathologist   The Technical Component Processing, Analysis and Professional Component of this test was completed at [facility], [place], [place], [place]  / [address] / [phone] / CLIA #[id number] / Medical Director(s): Latricia Heft, M.D..     The undersigned pathologist has reviewed and acknowledged receipt of the above consultant's report. This represents a transcribed copy and is not intended to replace the original consultant's report.   T: 01/10/2020: dlm       Addendum #1 performed by Leroy Sea B. Gaspar Bidding, M.D. Electronically signed 01/10/2020   Specimen Collected: 01/10/20 5:01 PM Last Resulted: 01/10/20 5:01 PM   Received From: [facility]  Result Received: 01/11/20 5:21 PM          Assessment and Plan       Terry [name] is a 54 y.o. 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  He has multifocal disease with a 2nd site left parietal lobe resulting in hemibody numbness and tingling. We discussed options such as radiation alone vs maximal resection using a functional mapping approach to remove the parietal tumor as well. Given his functional imaging and symptoms profile the posterior lesion is almost certainly within primary sensory, not motoir cortex.  We discussed the rationale for each approach an, expected benefits, and risks.  Either way the urgency of the issues was explained that he could chose either option, but I would not delay his radiation past this week unless he elected to proceed with additional resection. I wold be delighted to treat him here at [facility] and answered all questions to the best of my ability.  Terry [name] would like to proceed with surgery.  I will discuss the plan with Dr Roselyn Reef who referred him.  We will proceed with surgical authorization and planning.         I spent a total of 28 minutes on this patient's care on the day of their visit excluding time spent related to any billed procedures. This time includes time spent with the patient as well as time spent  documenting in the medical record, reviewing patient's records and tests, obtaining history, placing orders, communicating with other healthcare professionals, counseling the patient, family, or caregiver, and/or care coordination for the diagnoses above.        I performed this evaluation using real-time telehealth tools, including a live video Zoom connection between my location and the patient's location. Prior to initiating, the patient consented to perform this evaluation using telehealth tools.

## 2020-01-13 NOTE — Telephone Encounter (Signed)
Pt had a video visit with Dr. Franchot Erichsen yesterday 01/12/2020 and surgery was discussed.  I spoke with pt today.  He states he would like to proceed with surgery on Thursday 01/20/2020.  Case scheduled and surgical itinerary sent to pt.     Pt is requesting a cost estimate.  I sent a message to the [facility] billing office asking them to follow up with pt.

## 2020-01-17 ENCOUNTER — Ambulatory Visit: Payer: PRIVATE HEALTH INSURANCE | Primary: Person

## 2020-01-17 DIAGNOSIS — Z01818 Encounter for other preprocedural examination: Secondary | ICD-10-CM

## 2020-01-17 MED ORDER — DOCUSATE SODIUM 100 MG CAPSULE
100 mg | ORAL | Status: DC
Start: 2020-01-17 — End: 2021-01-09

## 2020-01-17 MED ORDER — TRAZODONE 50 MG TABLET
50 | ORAL | 1.00 refills | 30.00000 days | Status: AC | PRN
Start: 2020-01-17 — End: 2021-01-09

## 2020-01-17 MED ORDER — OMEPRAZOLE 20 MG CAPSULE,DELAYED RELEASE
20 | ORAL | Status: DC
Start: 2020-01-17 — End: 2020-01-21

## 2020-01-17 MED ORDER — LORAZEPAM 0.5 MG TABLET
0.5 | ORAL | 0.00 refills | 10.00000 days | Status: AC | PRN
Start: 2020-01-17 — End: 2021-01-09

## 2020-01-17 NOTE — Anesthesia Pre-Procedure Evaluation (Addendum)
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]    Pre-op diagnosis: Glioblastoma (CMS code) [C71.9]    Location: [facility] OR 07 / [facility]    Surgeons: Rayfield Citizen, MD          Precautions          None          Relevant Problems   No relevant active problems         Anesthesia Encounter History        CC/HPI/Past Medical History Summary: 54 yo male diagnosed w/glioblastoma, s/f Crani for resection on    PMH  - OSA/CPAP - instructed to bring to hospital  - High dose steroids causing sensation of chest constriction after taking each dose and unassociated w/exertion, along with other sx of insomnia, irritability and frequent urination.  These sx were not present prior to taking steroids.  Pt denies history of cardiopulmonary dz and prior to glioblastoma diagnosis was otherwise healthy.  Pt states his Oncologist is managing his Dexamethasone and is aware of his sx.  D/w Dr. Lovena Le and having patient come to Prepare for ECG on 01/18/20.    - GERD - omeprazole    (Please refer to APeX Allergies, Problems, Past Medical History, Past Surgical History, Social History, and Family History activities, Results for current data from these respective sections of the chart; these sections of the chart are also summarized in reports, including the Patient Summary Extracts found in Chart Review)      Summary of Outside Records:  Outside Cardiology Records Scanned - - - Please see 'Outside Scanned Records' in Chart Review  12/21/19 ECG  SB @ 56bpm  Inferior Infarct,age undetermined  (rev'd by Dr. Lovena Le)  Summary of Prior Anesthetics: Previous anesthetic without AAC  12/25/19 - 7.5 ETT, Miller 3, Gr IIa view (partial view of  glottis), Mask - 2 Vent by mask + OA or adjuvent +/- NMBA  - Difficult IV during Brain biopsy  - Thinks he may have been agitated upon wakening but doesn't clearly remember          Patient Active Problem List   Diagnosis    Glioblastoma (CMS code)     Past Medical History:   Diagnosis Date    Glioblastoma (CMS code)      Past Surgical History:   Procedure Laterality Date    BRAIN BIOPSY  12/25/2019     Allergies/Contraindications  No Known Allergies  Current Medications       Dosage    acetaminophen (TYLENOL) 500 mg tablet Take 1,000 mg by mouth every 6 (six) hours as needed    dexAMETHasone (DECADRON) 4 mg tablet Take 4 mg by mouth Twice a day       docusate sodium (COLACE) 100 mg capsule Take 100 mg by mouth in the morning and at bedtime    levETIRAcetam (KEPPRA) 500 mg tablet Take 500 mg by mouth 2 (two) times daily    LORazepam (ATIVAN) 0.5 mg tablet Take 0.5 mg by mouth every 6 (six) hours as needed for Anxiety 0.35m to 1 mg prn    omeprazole (PRILOSEC) 20 mg capsule Take 20 mg by  mouth every morning    ondansetron (ZOFRAN) 8 mg tablet Take 8 mg by mouth every 8 (eight) hours as needed       traZODone (DESYREL) 100 mg tablet Take 100 mg by mouth nightly at bedtime    temozolomide (TEMODAR) 180 mg capsule Take 180 mg by mouth daily        Ht 180.3 cm (_0 )   Wt (!) 120.2 kg (265 lb)   BMI 36.96 kg/m        Patient Active Problem List   Diagnosis    Glioblastoma (CMS code)     Past Medical History:   Diagnosis Date    Glioblastoma (CMS code)      Past Surgical History:   Procedure Laterality Date    BRAIN BIOPSY  12/25/2019     Allergies/Contraindications  No Known Allergies  Current Medications       Dosage    acetaminophen (TYLENOL) 500 mg tablet Take 1,000 mg by mouth every 6 (six) hours as needed    dexAMETHasone (DECADRON) 4 mg tablet Take 4 mg by mouth Twice a day       docusate sodium (COLACE) 100 mg capsule Take 100 mg by mouth in the morning and at bedtime    levETIRAcetam (KEPPRA) 500 mg  tablet Take 500 mg by mouth 2 (two) times daily    LORazepam (ATIVAN) 0.5 mg tablet Take 0.5 mg by mouth every 6 (six) hours as needed for Anxiety 0.62m to 1 mg prn    omeprazole (PRILOSEC) 20 mg capsule Take 20 mg by mouth every morning    ondansetron (ZOFRAN) 8 mg tablet Take 8 mg by mouth every 8 (eight) hours as needed       traZODone (DESYREL) 100 mg tablet Take 100 mg by mouth nightly at bedtime    temozolomide (TEMODAR) 180 mg capsule Take 180 mg by mouth daily        Ht 180.3 cm (_1 )   Wt (!) 120.2 kg (265 lb)   BMI 36.96 kg/m      Review of Systems Functional Status: Climb a flight of stairs or walk up a hill (5.50 METs)   Constitutional: Negative for chills and fever.   Airway: Positive for witnessed apnea and CPAP. Negative for limited neck movement, difficulty opening mouth, loose teeth, dental hardware, other dental problems and TMJ problem  HENT: Negative for hearing loss and trouble swallowing.   Eyes: Positive for visual disturbance (reading glasses).   Respiratory: Positive for shortness of breath (w/going up stairs). Negative for Recent URI Symptoms.         Denies lung dz.   Cardiovascular: Positive for chest pain (feels constricted across his chest, since starting the dexamethasone). Negative for leg swelling, orthopnea and palpitations.        Denies cardiac dz., MI.   Gastrointestinal: Positive for GERD Symptoms (omeprazole).        Denies liver dz.   Genitourinary: Negative for difficulty urinating and dysuria.        Denies kidney   Musculoskeletal: Negative.    Skin: Positive for color change (bruising on right hand from IV in early August.).   Neurological: Positive for light-headedness (d/t insomnia from steroids). Negative for headaches, speech difficulty, syncope, weakness, seizures and dizziness.   Hematological: Does not bruise/bleed easily.        Denies blood tx., bleeding d/o   Psychiatric/Behavioral/Developmental: Positive for sleep disturbance (d/t steroids).       Physical  Exam  Airway:   Modified Mallampati score: II. Thyromental distance: > 6.5 cm. Mouth opening: good. Neck range of motion: full.   Constitutional:    Constitutional system normal     Appearance: He is well-developed and well-nourished.   HENT:      Mouth/Throat:      Dentition: Normal.   Cardiovascular:      Rate and Rhythm: Normal rate and regular rhythm.   Pulmonary:      Effort: Pulmonary effort is normal.   Neurological:   Level of consciousness: alert.      Dental: dentition is normal.        PC only needs PE on DOS.  AVS discussed and sent to My Chart. Sdowd, NP  Prepare (Pre-Operative Clinic) Assessment/Plan/Narrative  8/31 EKG NSR no st changes.   Inferior infarct age undetermined unchanged from 8/3  D [place]      Obstructive Sleep Apnea Screening  STOPBANG Score:      S Do you Snore loudly (louder than talking or loud  enough to be heard through closed doors)?     T Do you often feel Tired, fatigued, or sleepy during daytime?     O Has anyone Observed you stop breathing during  your sleep?     P Do you have or are you being treated for high blood Pressure?     B BMI more than 35 KG/m^2? Yes    A Age over 29 years old? Yes    N Neck circumference > 16 inches (40cm)?     G Gender: Male? Yes    STOPBANG Total Score 3  Bypassed       Risk Level (based only on STOPBANG) Intermediate - Incomplete     Not eligible for complete STOP BANG screening due to: Prescribed CPAP/BiPAP  CPAP/BiPAP prescribed - Yes.              Anesthesia Assessment and Plan  Day Of Surgery Provider Chart Review:  NPO status verified  Medications reviewed  Allergies reviewed  Problem list reviewed  Anesthesia history reviewed  Pertinent labs reviewed  Consults reviewed    ASA 2       Anesthesia Plan  Anesthesia Type: general and MAC (GA backup)  Induction Technique: IntraVenous  Invasive Monitors/Vascular Access: None  Airway Plan: oral ET tube and supraglottic airway (backup)  Planned Recovery Location: ICU/PICU/ICN    Blood Product  Preparation  Blood Products Plan: Reviewed peri-op blood report  Consent: reviewed consent for blood transfusion  Plan: Appropriate blood products available/type and screen performed  Comments:    Anesthesia Potential Complication Discussion  There is the possibility of rare but serious complications.    Informed Consent for Anesthesia  Consent obtained from patient    Risks, benefits and alternatives including those of invasive monitoring discussed. Increased risks (as above) discussed.  Questions invited and all answered.  Interpreter: N/A - patient/guardian's preferred language is [place].  Consent granted for anesthetic plan    Quality Measure Documentation   Opioid Therapy Planned? Yes    (See Anesthesia Record for attending attestation)    [Please note, smart link data included in this note may not reflect changes since note creation. Please see appropriate section of APeX for up-to-the minute information.]

## 2020-01-17 NOTE — Patient Instructions (Addendum)
Please come to [name], [name] @ 3:30PM for Blood work and ECG.    We want your upcoming surgical visit to be as safe and comfortable as possible. The following instructions are designed to prepare you for your surgical hospitalization.  Please read and follow all instructions carefully.    Procedure Location  Your procedure is scheduled to take place at [facility]: [place]. 1st Floor. Report to Room M104J at assigned ARRIVAL time noted below. Phone [phone]    Procedure Date and Time  Please arrive on 01/20/2020 per surgeon's office.       Contact your surgeon's office if you have not received your time of arrival for the day surgery    If for any reason your surgery start time is changed, you will receive a call from your surgeons office.  Should you have any questions regarding the date or time of arrival, please call your surgeons office and ask to speak with his/her practice assistant.    Preparing for Surgery  What can I eat or drink on the day of surgery?   Please do not have anything to eat or drink except clear liquids after midnight the evening before your surgery (including gum, candy or mints).   You may have clear liquids on the day of surgery up to 2 hours prior to arrival.   Clear liquids include:   Non-pulp, clear apple juice.   Tea with sugar or sweetener (NO milk, cream, or milk substitute)   Gatorade   Water   If you drink anything other than clear liquids on the day of surgery, or if you have ANYTHING to drink in the two hours prior to hospital arrival, then your doctors will cancel your surgery for your safety.   If you have been instructed to take any medications the day of surgery, take them with a small sip of water.     If your surgeon has given you additional instructions about what to eat or drink before the day of surgery, please follow those instructions as well.    Which medications should I take on the day of surgery?    Terry [name], Terry [name]   Prior to Surgery Medication Instructions [id number]    Printed on:01/17/20 1131   Medication Information Take Morning of Surgery Special Instructions          acetaminophen (TYLENOL) 500 mg tablet  (acetaminophen)  Take 1,000 mg by mouth every 6 (six) hours as needed   May take as needed on morning of surgery     dexAMETHasone (DECADRON) 4 mg tablet  (dexamethasone)  Take 4 mg by mouth Twice a day      Take on morning of surgery     docusate sodium (COLACE) 100 mg capsule  (docusate sodium)  Take 100 mg by mouth in the morning and at bedtime   Take on morning of surgery     levETIRAcetam (KEPPRA) 500 mg tablet  (levetiracetam)  Take 500 mg by mouth 2 (two) times daily   Take on morning of surgery     LORazepam (ATIVAN) 0.5 mg tablet  (lorazepam)  Take 0.5 mg by mouth every 6 (six) hours as needed for Anxiety 0.5mg  to 1 mg prn   May take as needed     omeprazole (PRILOSEC) 20 mg capsule  (omeprazole)  Take 20 mg by mouth every morning   Take on morning of surgery     ondansetron (ZOFRAN) 8  mg tablet  (ondansetron HCl)  Take 8 mg by mouth every 8 (eight) hours as needed      Take on morning of surgery as needed           traZODone (DESYREL) 100 mg tablet  (trazodone HCl)  Take 100 mg by mouth nightly at bedtime    Take evening before surgery        Preparing to Come to the [facility] with Hibiclens (Chlorhexidine) soap or chlorhexidine cloth to prevent infections     Instructions:   You should shower with Hibiclens soap or chlorhexidine cloth at least two times before your surgery-on the night before surgery and the morning before your surgery.     How to shower with Hibiclens soap:   1.  Rinse your body with warm water.   2.  Wash your hair with regular shampoo.   Rinse your hair with water. If you are having neck surgery, use Hibiclens soap instead of your regular shampoo to wash your hair.   Rinse your hair with water.   3.  Wet a clean sponge. Turn off the water. Apply Hibiclens soap  liberally.   4.  Firmly massage all areas: neck, arms, chest, back, abdomen, hips, groin, genitals (external only) and buttocks. Avoid using Hibiclens soap on your face. Clean your legs and feet and between your fingers and toes. Pay special attention to the site of your surgery and all surrounding skin. Ask for help to clean your back if you are having back surgery.   5.  Lather again before rinsing.   6.  Turn on the water and rinse Hibiclens off your body.   7.  Dry off with a clean towel.   8.  Don't apply lotions or powders.   9.  Use clean clothes and freshly laundered bed linens    How to wash with Ready Prep CHG (Chlorhexidine Cloth)              1.  Rinse your body with warm water.         2.  Wash your hair with regular shampoo. Rinse your hair with water.        3.  Use one cloth to cleanse treatment area        4.  Vigorously scrub skin back and forth for three minutes        5.  Allow area to dry for one minute. Do not rinse        6.  Do not use on open wounds   7. See package details for more instructions    Important showering reminders:    Do not use any other soaps or body wash when using Hibiclens. Other soaps can block the Hibiclens benefits.    After showering, do not apply lotion, cream, powder, deodorant, or hair conditioner.    Do not shave or remove body hair. Shaving your face is generally fine. If you are having head surgery, however, ask your doctor whether you can shave.    Hibiclens is safe to use on minor wounds, rashes, burns, and over staples and stitches.    Allergic reactions are rare but may occur. If you have an allergic reaction, stop using Hibiclens and call your doctor if you have a skin irritation.    If you are allergic to Hibiclens, please follow the bathing instructions above using an over-the-counter regular soap instead of Hibiclens..    If your surgeon has given you  additional instructions, please follow those.           Wear casual, loose fitting, comfortable  clothing and leave all valuables, including jewelry, and large sums of cash at home.   Leave your valuables at home. Belongings that remain with you are your responsibility. [facility] is not liable for loss or damage. If valuables are brought to the hospital, they will be identified and recorded by our admitting team. All jewelry must be removed and left at home. If rings cannot be removed, we encourage you to have them removed by a jeweler prior to surgery to avoid damage or loss of ring.  [facility] is not responsible of lose or damaged jewelry. This policy protects the patient and prevents the items from being lost or damaged.    Leave contact lenses at home. Wear your eyeglasses and bring a case.   If you develop any illness prior to surgery (fever, cough, sore throat, cold, flu, infection), OR START A NEW MEDICATION, please call your surgeon and the [facility] at [phone].   If you are spending the night you may bring toiletries and sleeping clothes if you desire; otherwise the hospital will provide them for you.    DO NOT bring your medications with you to the hospital unless you were specifically instructed to do so.   DO bring a list of your medications including dose(s) and when you take them.   DO bring TWO forms of ID - including one ID with a photo.    Leaving the Hospital   Please ask your surgeon about your anticipated length of stay.   Please make sure your ride is available to pick you up by 12N on day of discharge   It is recommended that all patients have a responsible person at home the first night after discharge from the hospital.   ALL patients, including same day surgery patients, must arrange for an adult to drive/escort them home upon discharge.  Patients going home the same day of surgery will have their procedure cancelled if these arrangements are not made ahead of time.    Family and Friends  Friends and family may wait in the assigned waiting areas.  Patient  care coordinators are available in these patient waiting areas to provide updates regarding patient progress; hospital room assignments; and discharge planning (for same day surgery).    [facility] OR, [place]. Room #M104J    Guided Imagery  Research has shown that listening to guided imagery is helpful for many health conditions.  [facility] offers these sessions to listen to at the following website: [email or web address].http://www.adams.info/     Please consider guided imagery to prepare for surgery and for coping with stress, sleep and pain.      COVID Screening On-Demand  Screening for COVID is required for all patients and visitors arriving to a [facility] facility. To expedite the screening process, you are welcome to utilize the [facility] screening on-demand tool. Please follow the link from your smartphone or scan the QR code to open the screening tool on the day of your arrival and answer the screening questions prior to your arrival.     On-demand COVID screening: tiny.Person[employer].co.uk      Temporary Visitor Restrictions During the COVID-19 Emergency    Wed like to provide advance notice of additional protections our hospital has temporarily put in place for the safety of our patients and visitors, including you, your loved ones, and our healthcare providers.  Visitors are restricted from the hospital and our surgical waiting area will be closed to non-patients.   If you are not going home the day of surgery, we ask that your family members/visitors stay at home.   If you are going home the day of your surgery, your family member or person who will drive you home will be unable to accompany you to the preop / pacu area.  We strongly encourage your family member/driver to practice social distancing in the location they chose to wait.  If the driver lives in [place], we encourage them to return home to wait.       Patients will need to undergo a health screening on arrival and  will not be allowed if they have symptoms of illness, including cough, runny nose, sneezing, fever, and sore throat.   Additionally, children under the age of 66 are restricted from the hospital.    Thank you for your cooperation.    [facility], Radiation Oncology Procedure Room Visitor Policy:     Patients are allowed one guest to accompany them to Radiation Oncology but must wait in our main waiting room, [facility] lobby, or [place] (or leave and return for patient pick-up)   Guests are not allowed to be in the procedure suites or patient holding bays   Our staff will be contacting the guest/caregiver regarding patients' status and pick-up information     FAQ     1.     What if I or the person who is scheduled to take me home following my procedure has one of the symptoms you stated?   Please attempt to secure a ride home with a different friend or family member. If thats not possible, please contact your surgeons office immediately to inquire whether or not it is appropriate to reschedule the procedure.     2.     Why are children not allowed?   This is a precaution put in place by our clinical leadership and intended to ensure the safety of our patients. Younger children can carry viruses even when they are not symptomatic.

## 2020-01-18 ENCOUNTER — Ambulatory Visit: Discharge: 2020-02-17 | Payer: PRIVATE HEALTH INSURANCE | Primary: Person

## 2020-01-18 DIAGNOSIS — C719 Malignant neoplasm of brain, unspecified: Secondary | ICD-10-CM

## 2020-01-18 DIAGNOSIS — Z01818 Encounter for other preprocedural examination: Secondary | ICD-10-CM

## 2020-01-19 ENCOUNTER — Ambulatory Visit: Admit: 2020-01-19 | Payer: PRIVATE HEALTH INSURANCE | Primary: Person

## 2020-01-19 ENCOUNTER — Inpatient Hospital Stay: Admit: 2020-01-20 | Payer: PRIVATE HEALTH INSURANCE | Primary: Person

## 2020-01-19 ENCOUNTER — Ambulatory Visit: Admit: 2020-01-19 | Discharge: 2020-01-19 | Payer: PRIVATE HEALTH INSURANCE | Primary: Person

## 2020-01-19 ENCOUNTER — Inpatient Hospital Stay: Admit: 2020-01-19 | Payer: PRIVATE HEALTH INSURANCE | Primary: Person

## 2020-01-19 DIAGNOSIS — C719 Malignant neoplasm of brain, unspecified: Secondary | ICD-10-CM

## 2020-01-19 DIAGNOSIS — Z006 Encounter for examination for normal comparison and control in clinical research program: Secondary | ICD-10-CM

## 2020-01-19 DIAGNOSIS — Z01818 Encounter for other preprocedural examination: Secondary | ICD-10-CM

## 2020-01-19 LAB — PRE-SURGICAL TYPE AND SCREEN (OUTPATIENTS ONLY)
ABO/RH(D): O POS
Antibody Screen: NEGATIVE
Surgery Date: 20210902

## 2020-01-19 LAB — COMPLETE BLOOD COUNT
Hematocrit: 49.8 % (ref 41.0–53.0)
Hemoglobin: 16 g/dL (ref 13.6–17.5)
MCH: 32.8 pg (ref 26.0–34.0)
MCHC: 32.1 g/dL (ref 31.0–36.0)
MCV: 102 fL — ABNORMAL HIGH (ref 80–100)
Platelet Count: 185 10*9/L (ref 140–450)
RBC Count: 4.88 10*12/L (ref 4.40–5.90)
WBC Count: 13.8 10*9/L — ABNORMAL HIGH (ref 3.4–10.0)

## 2020-01-19 LAB — ECG 12-LEAD
Atrial Rate: 63 {beats}/min
Calculated P Axis: 51 degrees
Calculated R Axis: -6 degrees
Calculated T Axis: 12 degrees
P-R Interval: 142 ms
QRS Duration: 92 ms
QT Interval: 384 ms
QTcb: 392 ms
Ventricular Rate: 63 {beats}/min

## 2020-01-19 LAB — COVID-19 RNA, RT-PCR/NUCLEIC ACID AMPLIFICATION: COVID-19 RNA, RT-PCR/Nucleic Acid Amplification: NOT DETECTED

## 2020-01-19 LAB — GLUCOSE, NON-FASTING: Glucose, non-fasting: 114 mg/dL (ref 70–199)

## 2020-01-19 LAB — SODIUM, SERUM / PLASMA: Sodium, Serum / Plasma: 141 mmol/L (ref 135–145)

## 2020-01-19 LAB — CREATININE, SERUM / PLASMA
Creatinine: 1.22 mg/dL (ref 0.73–1.24)
eGFR - high estimate: 77 mL/min (ref >59)
eGFR - low estimate: 66 mL/min (ref >59)

## 2020-01-19 LAB — PROTHROMBIN TIME
INR: 1.1 (ref 0.9–1.2)
PT: 13.3 s (ref 11.7–14.9)

## 2020-01-19 LAB — POTASSIUM, SERUM / PLASMA: Potassium, Serum / Plasma: 4.5 mmol/L (ref 3.5–5.0)

## 2020-01-19 MED ORDER — GADOTERATE MEGLUMINE 0.5 MMOL/ML (376.9 MG/ML) INTRAVENOUS SOLUTION
0.5 | Freq: Once | INTRAVENOUS | Status: AC
Start: 2020-01-19 — End: 2020-01-19
  Administered 2020-01-19: 18:00:00 20 mL/kg via INTRAVENOUS

## 2020-01-20 ENCOUNTER — Inpatient Hospital Stay
Admit: 2020-01-20 | Discharge: 2020-01-25 | Disposition: A | Discharge status: Home / Self Care | Payer: PRIVATE HEALTH INSURANCE | Source: Ambulatory Visit | Attending: Physician | Admitting: Physician

## 2020-01-20 DIAGNOSIS — K219 Gastro-esophageal reflux disease without esophagitis: Secondary | ICD-10-CM

## 2020-01-20 DIAGNOSIS — R4701 Aphasia: Secondary | ICD-10-CM

## 2020-01-20 DIAGNOSIS — C718 Malignant neoplasm of overlapping sites of brain: Secondary | ICD-10-CM

## 2020-01-20 DIAGNOSIS — Z9989 Dependence on other enabling machines and devices: Secondary | ICD-10-CM

## 2020-01-20 DIAGNOSIS — R29810 Facial weakness: Secondary | ICD-10-CM

## 2020-01-20 DIAGNOSIS — G4733 Obstructive sleep apnea (adult) (pediatric): Secondary | ICD-10-CM

## 2020-01-20 DIAGNOSIS — G936 Cerebral edema: Secondary | ICD-10-CM

## 2020-01-20 DIAGNOSIS — C719 Malignant neoplasm of brain, unspecified: Secondary | ICD-10-CM

## 2020-01-20 DIAGNOSIS — Z20822 Contact with and (suspected) exposure to covid-19: Secondary | ICD-10-CM

## 2020-01-20 DIAGNOSIS — Z01818 Encounter for other preprocedural examination: Secondary | ICD-10-CM

## 2020-01-20 LAB — COMPLETE BLOOD COUNT WITH DIFFERENTIAL
Abs Basophils: 0.01 10*9/L (ref 0.00–0.10)
Abs Eosinophils: 0 10*9/L (ref 0.00–0.40)
Abs Imm Granulocytes: 0.15 10*9/L — ABNORMAL HIGH (ref <0.10)
Abs Lymphocytes: 0.39 10*9/L — ABNORMAL LOW (ref 1.00–3.40)
Abs Monocytes: 0.94 10*9/L — ABNORMAL HIGH (ref 0.20–0.80)
Abs Neutrophils: 11.56 10*9/L — ABNORMAL HIGH (ref 1.80–6.80)
Hematocrit: 44 % (ref 41.0–53.0)
Hemoglobin: 14.2 g/dL (ref 13.6–17.5)
MCH: 32.6 pg (ref 26.0–34.0)
MCHC: 32.3 g/dL (ref 31.0–36.0)
MCV: 101 fL — ABNORMAL HIGH (ref 80–100)
Platelet Count: 160 10*9/L (ref 140–450)
RBC Count: 4.35 10*12/L — ABNORMAL LOW (ref 4.40–5.90)
WBC Count: 13.1 10*9/L — ABNORMAL HIGH (ref 3.4–10.0)

## 2020-01-20 LAB — BASIC METABOLIC PANEL (NA, K, CL, CO2, BUN, CR, GLU, CA)
Anion Gap: 10 (ref 4–14)
Calcium, total, Serum / Plasma: 8.1 mg/dL — ABNORMAL LOW (ref 8.4–10.5)
Carbon Dioxide, Total: 20 mmol/L — ABNORMAL LOW (ref 22–29)
Chloride, Serum / Plasma: 105 mmol/L (ref 101–110)
Creatinine: 1.23 mg/dL (ref 0.73–1.24)
Glucose, non-fasting: 175 mg/dL (ref 70–199)
Potassium, Serum / Plasma: 4.7 mmol/L (ref 3.5–5.0)
Sodium, Serum / Plasma: 135 mmol/L (ref 135–145)
Urea Nitrogen, Serum / Plasma: 31 mg/dL — ABNORMAL HIGH (ref 7–25)
eGFR - high estimate: 76 mL/min (ref >59)
eGFR - low estimate: 66 mL/min (ref >59)

## 2020-01-20 LAB — BLOOD TYPE CONFIRMATION (REQUIRES SEPARATE PHLEBOTOMY): ABO/RH(D): O POS

## 2020-01-20 LAB — PROTHROMBIN TIME
INR: 1.1 (ref 0.9–1.2)
PT: 13.7 s (ref 11.7–14.9)

## 2020-01-20 MED ORDER — LEVETIRACETAM 500 MG TABLET
500 mg | ORAL | Status: DC
  Administered 2020-01-21 – 2020-01-22 (×4): 500 mg via ORAL

## 2020-01-20 MED ORDER — METOCLOPRAMIDE 5 MG/ML INJECTION SOLUTION
5 mg/mL | INTRAMUSCULAR | Status: DC | PRN
  Administered 2020-01-20: 19:00:00 10 via INTRAVENOUS

## 2020-01-20 MED ORDER — BACITRACIN 500 UNIT/GRAM TOPICAL OINTMENT
500 | TOPICAL | Status: AC
Start: 2020-01-20 — End: ?

## 2020-01-20 MED ORDER — ACETAMINOPHEN 500 MG GELCAP: 500 mg | ORAL | Status: DC | PRN

## 2020-01-20 MED ORDER — LIDOCAINE 2 % MUCOSAL JELLY IN APPLICATOR
2 | Status: DC | PRN
Start: 2020-01-20 — End: 2020-01-20
  Administered 2020-01-20: 15:00:00 1 via TOPICAL

## 2020-01-20 MED ORDER — NOREPINEPHRINE BITARTRATE 1 MG/ML INTRAVENOUS SOLUTION
1 mg/mL | INTRAVENOUS | Status: DC | PRN
Start: 2020-01-20 — End: 2020-01-20
  Administered 2020-01-20: 15:00:00 .05 via INTRAVENOUS
  Administered 2020-01-20: 16:00:00 .02 via INTRAVENOUS

## 2020-01-20 MED ORDER — VANCOMYCIN 500 MG INTRAVENOUS SOLUTION
500 | INTRAVENOUS | Status: DC | PRN
Start: 2020-01-20 — End: 2020-01-20
  Administered 2020-01-20: 16:00:00 500 via TOPICAL

## 2020-01-20 MED ORDER — MENTHOL 5.8 MG LOZENGES: 5.8 mg | Status: DC | PRN

## 2020-01-20 MED ORDER — LACTATED RINGERS INTRAVENOUS SOLUTION
INTRAVENOUS | Status: DC
Start: 2020-01-20 — End: 2020-01-20
  Administered 2020-01-20: 15:00:00 via INTRAVENOUS

## 2020-01-20 MED ORDER — NALOXONE 0.4 MG/ML INJECTION SOLUTION: 0.4 mg/mL | INTRAMUSCULAR | Status: DC | PRN

## 2020-01-20 MED ORDER — ACETAMINOPHEN 1,000 MG/100 ML (10 MG/ML) INTRAVENOUS SOLUTION
1,000 mg/100 mL (10 mg/mL) | INTRAVENOUS | Status: DC | PRN
  Administered 2020-01-20: 17:00:00 1000 mg via INTRAVENOUS

## 2020-01-20 MED ORDER — LIDOCAINE 1 %-EPINEPHRINE 1:100,000 INJECTION SOLUTION
1 | INTRAMUSCULAR | Status: AC
Start: 2020-01-20 — End: ?

## 2020-01-20 MED ORDER — LIDOCAINE 2 % MUCOSAL JELLY IN APPLICATOR
2 | Status: AC
Start: 2020-01-20 — End: ?

## 2020-01-20 MED ORDER — OXYCODONE 5 MG TABLET
5 mg | ORAL | Status: DC | PRN
  Administered 2020-01-21: 5 mg via ORAL
  Administered 2020-01-21: 05:00:00 10 mg via ORAL
  Administered 2020-01-21: 01:00:00 5 mg via ORAL

## 2020-01-20 MED ORDER — SODIUM CHLORIDE 0.9 % (FLUSH) INJECTION SYRINGE
0.9 % | INTRAMUSCULAR | Status: DC | PRN
  Administered 2020-01-21 – 2020-01-22 (×6): 3 mL via INTRAVENOUS

## 2020-01-20 MED ORDER — PROCHLORPERAZINE EDISYLATE 10 MG/2 ML (5 MG/ML) INJECTION SOLUTION: 10 mg/2 mL (5 mg/mL) | INTRAMUSCULAR | Status: DC | PRN

## 2020-01-20 MED ORDER — HYDROMORPHONE (PF) 0.5 MG/0.5 ML INJECTION SYRINGE
0.5 mg/0.5 mL | INTRAMUSCULAR | Status: DC | PRN
  Administered 2020-01-21 – 2020-01-25 (×10): 0.5 mg via INTRAVENOUS

## 2020-01-20 MED ORDER — SODIUM CHLORIDE 0.9 % INTRAVENOUS SOLUTION
0.9 | INTRAVENOUS | Status: DC | PRN
Start: 2020-01-20 — End: 2020-01-20

## 2020-01-20 MED ORDER — AMINOLEVULINIC ACID HCL 30 MG/ML ORAL SOLUTION
30 | Freq: Once | ORAL | Status: AC
Start: 2020-01-20 — End: 2020-01-20
  Administered 2020-01-20: 13:00:00 2403 mg/kg via ORAL

## 2020-01-20 MED ORDER — REMIFENTANIL 2 MG INTRAVENOUS SOLUTION
2 mg | INTRAVENOUS | Status: DC | PRN
Start: 2020-01-20 — End: 2020-01-20
  Administered 2020-01-20: 15:00:00 .02 via INTRAVENOUS

## 2020-01-20 MED ORDER — MEPERIDINE (PF) 25 MG/ML INJECTION SOLUTION: 25 mg/mL | INTRAMUSCULAR | Status: DC | PRN

## 2020-01-20 MED ORDER — ACETAMINOPHEN 500 MG TABLET
500 mg | ORAL | Status: DC
  Administered 2020-01-21 – 2020-01-25 (×15): 1000 mg via ORAL

## 2020-01-20 MED ORDER — CEFAZOLIN 1 GRAM SOLUTION FOR INJECTION
1 | INTRAMUSCULAR | Status: DC | PRN
Start: 2020-01-20 — End: 2020-01-20

## 2020-01-20 MED ORDER — PROPOFOL 10 MG/ML INTRAVENOUS EMULSION
10 | INTRAVENOUS | Status: DC | PRN
Start: 2020-01-20 — End: 2020-01-20

## 2020-01-20 MED ORDER — SENNOSIDES 8.6 MG TABLET
8.6 mg | ORAL | Status: DC
  Administered 2020-01-21 – 2020-01-25 (×5): 17.2 mg via ORAL

## 2020-01-20 MED ORDER — CEFAZOLIN 2G/20 ML NS INTRAVENOUS SYRINGE
2 g/20 mL | INTRAVENOUS | Status: DC
  Administered 2020-01-21: 01:00:00 2 g via INTRAVENOUS

## 2020-01-20 MED ORDER — HYDROMORPHONE (PF) 0.5 MG/0.5 ML INJECTION SYRINGE
0.5 mg/0.5 mL | INTRAMUSCULAR | Status: DC | PRN
  Administered 2020-01-20: 22:00:00 0.3 mg via INTRAVENOUS

## 2020-01-20 MED ORDER — ELECTROLYTE-A INTRAVENOUS SOLUTION
INTRAVENOUS | Status: DC | PRN
  Administered 2020-01-20: 15:00:00 via INTRAVENOUS

## 2020-01-20 MED ORDER — LIDOCAINE 1 %-EPINEPHRINE 1:100,000 INJECTION SOLUTION
1 %-:00,000 | INTRAMUSCULAR | Status: DC | PRN
Start: 2020-01-20 — End: 2020-01-20
  Administered 2020-01-20: 16:00:00 60 via INTRAMUSCULAR
  Administered 2020-01-20: 19:00:00 17 via INTRAMUSCULAR
  Administered 2020-01-20: 17:00:00 8 via INTRAMUSCULAR

## 2020-01-20 MED ORDER — ONDANSETRON HCL (PF) 4 MG/2 ML INJECTION SOLUTION
4 mg/2 mL | INTRAMUSCULAR | Status: DC | PRN
  Administered 2020-01-20: 16:00:00 8 via INTRAVENOUS

## 2020-01-20 MED ORDER — CEFAZOLIN 2G/20 ML NS INTRAVENOUS SYRINGE
2 | Freq: Three times a day (TID) | INTRAVENOUS | Status: AC
Start: 2020-01-20 — End: 2020-01-21
  Administered 2020-01-21 (×2): 2 g via INTRAVENOUS

## 2020-01-20 MED ORDER — SODIUM CHLORIDE 0.9 % INTRAVENOUS SOLUTION
0.9 % | INTRAVENOUS | Status: DC
  Administered 2020-01-20: 21:00:00 75 mL/h via INTRAVENOUS
  Administered 2020-01-20: 23:00:00
  Administered 2020-01-21: 07:00:00 75 mL/h via INTRAVENOUS

## 2020-01-20 MED ORDER — MANNITOL 20 % INTRAVENOUS SOLUTION
20 | INTRAVENOUS | Status: DC | PRN
Start: 2020-01-20 — End: 2020-01-20
  Administered 2020-01-20: 16:00:00 100 via INTRAVENOUS

## 2020-01-20 MED ORDER — BUPIVACAINE (PF) 0.5 % (5 MG/ML) INJECTION SOLUTION
5 | INTRAMUSCULAR | Status: AC
Start: 2020-01-20 — End: ?

## 2020-01-20 MED ORDER — ONDANSETRON HCL (PF) 4 MG/2 ML INJECTION SOLUTION
4 mg/2 mL | INTRAMUSCULAR | Status: DC | PRN
  Administered 2020-01-24 – 2020-01-25 (×2): 4 mg via INTRAVENOUS

## 2020-01-20 MED ORDER — MANNITOL 20 % INTRAVENOUS SOLUTION
20 | INTRAVENOUS | Status: AC
Start: 2020-01-20 — End: ?

## 2020-01-20 MED ORDER — SODIUM CHLORIDE 0.9 % (FLUSH) INJECTION SYRINGE
0.9 % | INTRAMUSCULAR | Status: DC
  Administered 2020-01-21 – 2020-01-25 (×15): 3 mL via INTRAVENOUS

## 2020-01-20 MED ORDER — CEFAZOLIN 1 GRAM SOLUTION FOR INJECTION
1 | INTRAMUSCULAR | Status: DC | PRN
Start: 2020-01-20 — End: 2020-01-20
  Administered 2020-01-20 (×2): 3000 via INTRAVENOUS

## 2020-01-20 MED ORDER — VANCOMYCIN 500 MG INTRAVENOUS SOLUTION
500 | INTRAVENOUS | Status: AC
Start: 2020-01-20 — End: ?

## 2020-01-20 MED ORDER — HYDROGEN PEROXIDE 3 % SOLUTION
3 | Status: DC | PRN
Start: 2020-01-20 — End: 2020-01-20
  Administered 2020-01-20: 15:00:00 118 via TOPICAL

## 2020-01-20 MED ORDER — DEXMEDETOMIDINE 200 MCG/50 ML (4 MCG/ML) IN 0.9 % SODIUM CHLORIDE IV
200 | INTRAVENOUS | Status: DC | PRN
Start: 2020-01-20 — End: 2020-01-20
  Administered 2020-01-20: 15:00:00 .5 via INTRAVENOUS

## 2020-01-20 MED ORDER — LABETALOL 5 MG/ML INTRAVENOUS SOLUTION
5 mg/mL | INTRAVENOUS | Status: DC | PRN
  Administered 2020-01-21 (×4): 10 mg via INTRAVENOUS

## 2020-01-20 MED ORDER — DEXAMETHASONE 4 MG TABLET
4 | Freq: Two times a day (BID) | ORAL | Status: AC
Start: 2020-01-20 — End: 2020-01-22
  Administered 2020-01-21 – 2020-01-23 (×5): 4 mg via ORAL

## 2020-01-20 MED ORDER — FENTANYL (PF) 50 MCG/ML INJECTION SOLUTION: 50 mcg/mL | INTRAMUSCULAR | Status: DC | PRN

## 2020-01-20 MED ORDER — LEVETIRACETAM 500 MG/5 ML INTRAVENOUS SOLUTION
500 | INTRAVENOUS | Status: AC
Start: 2020-01-20 — End: ?

## 2020-01-20 MED ORDER — OXYCODONE 5 MG TABLET: 5 mg | ORAL | Status: DC | PRN

## 2020-01-20 MED ORDER — DEXAMETHASONE SODIUM PHOSPHATE 4 MG/ML INJECTION SOLUTION
4 | INTRAMUSCULAR | Status: DC | PRN
Start: 2020-01-20 — End: 2020-01-20
  Administered 2020-01-20: 16:00:00 4 via INTRAVENOUS

## 2020-01-20 MED ORDER — PROPOFOL 10 MG/ML INTRAVENOUS EMULSION
10 | INTRAVENOUS | Status: DC | PRN
Start: 2020-01-20 — End: 2020-01-20
  Administered 2020-01-20: 15:00:00 25 via INTRAVENOUS

## 2020-01-20 MED ORDER — LEVETIRACETAM 500 MG/5 ML INTRAVENOUS SOLUTION
500 | INTRAVENOUS | Status: DC | PRN
Start: 2020-01-20 — End: 2020-01-20
  Administered 2020-01-20: 16:00:00 500 via INTRAVENOUS

## 2020-01-20 MED ORDER — SODIUM CHLORIDE 0.9 % INTRAVENOUS SOLUTION
0.9 % | INTRAVENOUS | Status: DC | PRN
  Administered 2020-01-20: 15:00:00 via INTRAVENOUS

## 2020-01-20 MED ORDER — ONDANSETRON HCL 4 MG TABLET: 4 mg | ORAL | Status: DC | PRN

## 2020-01-20 MED ORDER — POVIDONE-IODINE 10 % TOPICAL SOLUTION
10 | TOPICAL | Status: DC | PRN
Start: 2020-01-20 — End: 2020-01-20
  Administered 2020-01-20: 15:00:00 250 via TOPICAL

## 2020-01-20 MED ORDER — FENTANYL (PF) 50 MCG/ML INJECTION SOLUTION
50 | INTRAMUSCULAR | Status: DC | PRN
Start: 2020-01-20 — End: 2020-01-20
  Administered 2020-01-20 (×2): 25 via INTRAVENOUS

## 2020-01-20 MED ORDER — LIDOCAINE (PF) 10 MG/ML (1 %) INJECTION SOLUTION
10 | INTRAMUSCULAR | Status: AC
Start: 2020-01-20 — End: 2020-01-20
  Administered 2020-01-20: 15:00:00 1 via SUBCUTANEOUS

## 2020-01-20 MED FILL — ACETAMINOPHEN 500 MG TABLET: 500 mg | ORAL | Qty: 2

## 2020-01-20 MED FILL — BACITRACIN 500 UNIT/GRAM TOPICAL OINTMENT: 500 unit/gram | TOPICAL | Qty: 28.4

## 2020-01-20 MED FILL — OFIRMEV 1,000 MG/100 ML (10 MG/ML) INTRAVENOUS SOLUTION: 1000 mg/100 mL (10 mg/mL) | INTRAVENOUS | Qty: 100

## 2020-01-20 MED FILL — BUPIVACAINE (PF) 0.5 % (5 MG/ML) INJECTION SOLUTION: 5 mg/mL (0. %) | INTRAMUSCULAR | Qty: 90

## 2020-01-20 MED FILL — XYLOCAINE WITH EPINEPHRINE 1 %-1:100,000 INJECTION SOLUTION: 1 %-:00,000 | INTRAMUSCULAR | Qty: 20

## 2020-01-20 MED FILL — VANCOMYCIN 500 MG INTRAVENOUS SOLUTION: 500 mg | INTRAVENOUS | Qty: 10

## 2020-01-20 MED FILL — BUPIVACAINE (PF) 0.5 % (5 MG/ML) INJECTION SOLUTION: 5 mg/mL (0. %) | INTRAMUSCULAR | Qty: 30

## 2020-01-20 MED FILL — GLEOLAN 30 MG/ML ORAL SOLUTION: 30 mg/mL | ORAL | Qty: 80.1

## 2020-01-20 MED FILL — DILAUDID (PF) 0.5 MG/0.5 ML INJECTION SYRINGE: 0.5 mg/ mL | INTRAMUSCULAR | Qty: 0.5

## 2020-01-20 MED FILL — LEVETIRACETAM 500 MG/5 ML INTRAVENOUS SOLUTION: 500 mg/5 mL | INTRAVENOUS | Qty: 10

## 2020-01-20 MED FILL — LIDOCAINE 2 % MUCOSAL JELLY IN APPLICATOR: 2 % | Qty: 10

## 2020-01-20 MED FILL — MANNITOL 20 % INTRAVENOUS SOLUTION: 20 % | INTRAVENOUS | Qty: 500

## 2020-01-20 NOTE — Anesthesia Procedure Notes (Signed)
Arterial Line    Start Time: 01/20/2020 7:49 AM    General Information and Staff  Patient location during procedure: OR  Performed: anesthesia resident   Anesthesiologist: Tera Partridge, MBBS  Resident/Fellow/CRNA: Bearl Mulberry, MD    Pre-Procedure Checklist  Checks completed: patient identified, site and laterality marked and verified, H&P completed in last 30 days, H&P interval updated if >24 hours old, results of laboratory, pathology and radiology studies are present and matched to patient, surgical consent completed, risks and benefits discussed, monitors and equipment checked, oxygen available, suction available, emergency drugs available, IV checked, timeout performed and hand hygiene performed.      Indication: multiple ABGs and hemodynamic monitoring    Pre-procedure details:     Skin preparation:  Chloraprep    Preparation: Patient was prepped and draped in sterile fashion      Anesthesia  Anesthesia Type: local infiltration  Medications Administered  Lidocaine (PF) (XYLOCAINE) 10 mg/mL (1 %) injection, 1 mL        Procedure Details  Site: radial artery, left   Catheter gauge: 20 G   Placement technique: ultrasound guided    1 insertion attempt(s).    Post Procedure  Post-Procedure: secured with tape and wrist guard applied  Complications: none  Patient Tolerance: tolerated well, no immediate complications      End Time: 01/20/2020 7:56 AM  For full procedure information, see associated anesthesia event/encounter.

## 2020-01-20 NOTE — Progress Notes (Deleted)
DATE OF OPERATION: 9.2.2021    Name:  Terry [name]     Preop Dx: multifocal glioblastoma  Postop Dx: same    OPERATION:    1. Left frontal-parietal awake craniotomy for tumor resection  2. Use of intraoperative microscope for microdissection  3. Use of Brainlab Neuro-navigation  4. Cortical and Subcortical Language Mapping   5.   Cortical and Subcortical Motor Mapping    Surgeon: Norva Karvonen, MD     Resident: Candace Cruise, MD    Anesthesia: MAC     Findings:   1. Necrotic brain mass concerning for glioblastoma    Complications: none   EBL: 100 ml     IV Fluids: see anesthesia record    Urine Output: see anesthesia record    INDICATIONS FOR PROCEDURE:   This is a 54 year old right handed male who presented with hemisensory loss and aphasia found to have a left frontal and parietal enhancing brain mass concerning for high grade glioma.     CONSENT:   The patient and/or their family was given a full and complete explanation of the procedure including the risks of stroke, paralysis, infection, new neurological deficit, paralysis, death, hemorrhage, intracranial hemorrhage, vascular injury, alopecia, blindness, visual field deficit, kidney damage, need for emergent surgery as well as other unforeseeable complications together with the potential benefits and alternatives. The patient and/or their family fully consented to undergo this procedure. The primary surgeon (listed above) was scrubbed and present throughout the entire surgical procedure.     PROCEDURE IN DETAIL:   The patient was brought to the operating room suite and underwent uneventful induction of sedation. Given his body habitus we elected to use dexmedetomidine and light remifentil sedation in attempts to not suppress his respiratory drive. A timeout was completed verifying correct patient, procedure, site, implants, and special equipment. Following sedation the patient was placed in a head holder. The Brainlab navigational guidance system was registered  and utilized in a standard fashion to develop a stereotactic trajectory plan toward lesion. A U shaped incision was marked and infiltrated with long and short acting analgesic medications. He had an anterior linear incision which we incorporated into the posterior limb of the incision. After positioning the patient was given mannitol and decadron.     A myocutaneous flap was reflected inferior after which 4 burr holes were placed for the craniotomy. The bone flap was elevated and epidural tack up sutures were placed for hemostasis. The Brainlab was used to localize the tumor after the dura was opened and at this point the Brainlab guidance system was correlated with the gross anatomy. Before opening the dura and patient was asked to follow commands and sedation was lighted. The intraoperative mapping team was then called into the room.      Sensorimotor mapping identified sensory hand (#7 and 8) and motor hand (# 3 and 4) sites which were preserved. The anterior tumor was therefore anterior to motor and posterior tumor was within somatosensory cortex. Language tasks including picture naming, auditory naming, and sentence completion. Mapping revealed no cortical language sites.     We began with resection of the anterior frontal tumor which was resected through a function free corridor using bipolar coagulation, suction, and CUSA. Working circumferentially around the tumor margin the intraoperative microscope was used for optimal visualization and microdissection of en passage vessels. We then turned our attention to the parietal tumor. We began by removing the anterior margins of the tumor from along the central sulcus. We  continued in the manner until we reached the bottom of the sulcus at the subcortical U fibers at which time we performed subcortical motor mapping. The same approach was used along the superior, inferior, and posterior margins. Again, along the margins of the resection, subcortical mapping was  performed however no subcortical language sites were identified.  We had now reached the anatomic limits of the resection. Flourescence microscopy confirmed no additional tumor fluorescence.  Hemostasis was achieved and the resection cavity was lined with surgicel.     The dura was closed in a watertight fashion and a central tack up suture was placed. The bone flap was replaced with plates and screws. A subgaleal drain was placed and vancomycin powder applied to the subgaleal space. The temporalis muscle was re-approximated and galea was closed with 3-0 vicryl. The skin was closed with 4-0 Monocryl suture and a sterile dressing was applied. There were no apparent complications.       IMPRESSION:   Glioblastoma multifocal    PRIMARY RESPONSIBLE SURGEON: Norva Karvonen, M.D.     OPERATIVE Report completed BY: Norva Karvonen, M.D.

## 2020-01-20 NOTE — Progress Notes (Signed)
[facility] Course  9/2 - to OR    Subjective  Feeling comfortable, minimal pain    Vitals  Temp:  [36.2 C (97.2 F)] 36.2 C (97.2 F)  Heart Rate:  [53] 53  *Resp:  [16] 16  BP: (145)/(87) 145/87  SpO2:  [98 %] 98 %    Input / Output  I/O last 2 completed shifts plus current shift:  In: 2450 [I.V.:2350; IV Piggyback:100]  Out: 550 [Urine:500; Blood:50]  Indwelling Urinary Catheter 01/20/20 16 (Active)   Number of days: 0       Physical Exam  AOx3  Speech fluent, no paraphasic errors  PERRL, EOMI  V1-V3 intact  Face =   Hearing intact  TML    No pronator drift  MAE 5/5, slight trace R weakness 4++    Problem-based Assessment & Plan  Maximilien Hayashi [id number] ML/Bed   74M with multifocal glioma now s/p L crani for tumor resection 9/2. Exam: Trace R hemibody weakness (baseline) otherwise nonfocal.  - TCU  - SBP<140  - postop labs  - keppra 500 bid, decadron 4 bid  - no drain  - ALA low light precautions (day 0/3)  - postop MRI w/ DTI pending  - Neuro-oncology consult, appreciate recs      No new Assessment & Plan notes have been filed under this hospital service since the last note was generated.  Service: Neurosurgery         During this hospitalization the patient is being treated for:  Glioblastoma    Code Status: FULL    Adriana Simas, MD  01/20/2020

## 2020-01-20 NOTE — Op Note (Signed)
DATE OF OPERATION: 9.2.2021    Name:  Terry [name]     Preop Dx: multifocal glioblastoma  Postop Dx: same    OPERATION:    1. Left frontal-parietal awake craniotomy for tumor resection  2. Use of intraoperative microscope for microdissection  3. Use of Brainlab Neuro-navigation  4. Cortical and Subcortical Language Mapping   5.   Cortical and Subcortical Motor Mapping    Surgeon: Mertice Uffelman Hervey-Jumper, MD     Resident: Oh, MD    Anesthesia: MAC     Findings:   1. Necrotic brain mass concerning for glioblastoma    Complications: none   EBL: 100 ml     IV Fluids: see anesthesia record    Urine Output: see anesthesia record    INDICATIONS FOR PROCEDURE:   This is a 54 year old right handed male who presented with hemisensory loss and aphasia found to have a left frontal and parietal enhancing brain mass concerning for high grade glioma.     CONSENT:   The patient and/or their family was given a full and complete explanation of the procedure including the risks of stroke, paralysis, infection, new neurological deficit, paralysis, death, hemorrhage, intracranial hemorrhage, vascular injury, alopecia, blindness, visual field deficit, kidney damage, need for emergent surgery as well as other unforeseeable complications together with the potential benefits and alternatives. The patient and/or their family fully consented to undergo this procedure. The primary surgeon (listed above) was scrubbed and present throughout the entire surgical procedure.     PROCEDURE IN DETAIL:   The patient was brought to the operating room suite and underwent uneventful induction of sedation. Given his body habitus we elected to use dexmedetomidine and light remifentil sedation in attempts to not suppress his respiratory drive. A timeout was completed verifying correct patient, procedure, site, implants, and special equipment. Following sedation the patient was placed in a head holder. The Brainlab navigational guidance system was registered  and utilized in a standard fashion to develop a stereotactic trajectory plan toward lesion. A U shaped incision was marked and infiltrated with long and short acting analgesic medications. He had an anterior linear incision which we incorporated into the posterior limb of the incision. After positioning the patient was given mannitol and decadron.     A myocutaneous flap was reflected inferior after which 4 burr holes were placed for the craniotomy. The bone flap was elevated and epidural tack up sutures were placed for hemostasis. The Brainlab was used to localize the tumor after the dura was opened and at this point the Brainlab guidance system was correlated with the gross anatomy. Before opening the dura and patient was asked to follow commands and sedation was lighted. The intraoperative mapping team was then called into the room.      Sensorimotor mapping identified sensory hand (#7 and 8) and motor hand (# 3 and 4) sites which were preserved. The anterior tumor was therefore anterior to motor and posterior tumor was within somatosensory cortex. Language tasks including picture naming, auditory naming, and sentence completion. Mapping revealed no cortical language sites.     We began with resection of the anterior frontal tumor which was resected through a function free corridor using bipolar coagulation, suction, and CUSA. Working circumferentially around the tumor margin the intraoperative microscope was used for optimal visualization and microdissection of en passage vessels. We then turned our attention to the parietal tumor. We began by removing the anterior margins of the tumor from along the central sulcus. We   continued in the manner until we reached the bottom of the sulcus at the subcortical U fibers at which time we performed subcortical motor mapping. The same approach was used along the superior, inferior, and posterior margins. Again, along the margins of the resection, subcortical mapping was  performed however no subcortical language sites were identified.  We had now reached the anatomic limits of the resection. Flourescence microscopy confirmed no additional tumor fluorescence.  Hemostasis was achieved and the resection cavity was lined with surgicel.     The dura was closed in a watertight fashion and a central tack up suture was placed. The bone flap was replaced with plates and screws. A subgaleal drain was placed and vancomycin powder applied to the subgaleal space. The temporalis muscle was re-approximated and galea was closed with 3-0 vicryl. The skin was closed with 4-0 Monocryl suture and a sterile dressing was applied. There were no apparent complications.       IMPRESSION:   Glioblastoma multifocal    PRIMARY RESPONSIBLE SURGEON: Norva Karvonen, M.D.     OPERATIVE Report completed BY: Norva Karvonen, M.D.

## 2020-01-20 NOTE — H&P (Signed)
INTERVAL HISTORY AND PHYSICAL and RISKS / BENEFITS NOTE    Planned procedure: Left craniotomy for tumor resection, awake with speech mapping  Indications:  tumor    INTERVAL HISTORY AND PHYSICAL  The patient's history and physical were reviewed.  The patient was examined today.  There are no changes in the patient's health history or physical findings since the previously recorded history and physical.    INTERVAL LABS  Lab Results   Component Value Date/Time    PLT 185 01/19/2020 01:56 PM    INR 1.1 01/19/2020 01:56 PM     Labs normal  COVID negative    DOCUMENTATION OF INFORMED CONSENT  I have discussed the risks, benefits, and alternatives of the procedure with the patient and/or the patient's medical decision-maker.  This discussion included, but was not limited to, the risk of bleeding, infection, damage to anatomical structures, need for reoperation, or even death.  The patient and/or the patient's medical decision maker understands, has had all of his/her questions answered, and desires to proceed.  Informed consent obtained.

## 2020-01-20 NOTE — Anesthesia Post-Procedure Evaluation (Signed)
Anesthesia Post-op Evaluation    Scheduled date of Operation: 01/20/2020    Scheduled Surgeon(s):Shawn Level Hervey-Jumper, MDTaemin Oh, MD  Scheduled Procedure(s):awake left fronto-parietal CRANIOTOMY FOR SUPRATENTORIAL TUMOR RESECTIONNEURO BRAINLAB NAVIGATION (CRANIOTOMY)    Final Anesthesia Type: general, MAC    Assessment  Respiratory Function:      Airway Patency: Excellent      Respiratory Rate: See vitals below      SpO2: See vitals below      Overall Respiratory Assessment: Stable  Cardiovascular Function:      Pulse Rate: See Vitals Below      Blood Pressure: See Vitals Below      Cardiac status: Stable  Mental Status:      RASS Score: 0 Alert or calm  Temperature: Normothermic  Pain Control: Adequate  Nausea and Vomiting: Absent  Fluids/Hydration Status: Euvolemic    Complications (anesthesia/case associated complications, possible complications, and/or significant issues; as of time of note completion: No apparent complications      Plan  Follow-up care: As per primary team    Post-op Note Status: Complete, patient participated in evaluation, which occurred after recovery from anesthesia but prior to 48 hours from end of case                 Recent Pre-op and Post-op Vital Signs  Vitals:    01/20/20 0624   BP: (!) 145/87   Pulse: (!) 53   Resp: 16   Temp: 36.2 C (97.2 F)   TempSrc: Temporal   SpO2: 98%     Last Vital Signs Out of Room to Anesthesia Stop  Vitals Value Taken Time   Pulse 51 01/20/20 1324   Resp 12 01/20/20 1324   SpO2 97 % 01/20/20 1324   BP 121/67 01/20/20 1321   Arterial Line BP (mmHg)      Arterial Line MAP (mmHg)     Arterial Line 2 BP (mmHg)     Arterial Line 2 MAP (mmHg)     Temp     Temp src     Vitals shown include unvalidated device data.    Case Tracking Events:  Event Time In   In [place] - Pre Reg 0533   In [place]   In [place]   In Pre-op 0559   Anesthesia Start 0727   Anesthesia Ready 0802   In Room 0729   Airway-Start 0740   Airway-Stop 0740   Procedure Start 9509    PACU/Unit Notified 1200   Procedure Finish 1308   Extubation 3267   Out of Room 1317   Anesthesia [id number]   In PACU 1319

## 2020-01-20 NOTE — Brief Op Note (Signed)
Brief Operative Note    Surgeon(s) and Role:     * Shawn Level Hervey-Jumper, MD - Primary     * Adriana Simas, MD - Resident - Assisting    Date of Operation: 01-31-20    Pre-Op Diagnosis Codes:     * Glioblastoma (CMS code) [C71.9]    Postoperative Diagnosis: tumor    Procedure performed: L crani for tumor    Procedure(s) and Anesthesia Type:     * awake left fronto-parietal CRANIOTOMY FOR SUPRATENTORIAL TUMOR RESECTION - [facility]-Monitored Anesthesia Care     * NEURO BRAINLAB NAVIGATION (CRANIOTOMY) - [facility]-Monitored Anesthesia Care    Findings: same    Fluids: 2000 cc    Estimated Blood Loss: 100 cc    Wound Class:  1 - Clean    Incisional Closure Type: Primary closure (all tissues closed tightly or loosely, drains/hardware may exit)    Implants:   Implant Name Type Inv. Item Serial No. Manufacturer Lot No. LRB No. Used Action   SEALANT FLOSEAL 10ML HT FAST-PREP   [id number] - [id number] Other SEALANT FLOSEAL 10ML HT FAST-PREP   [id number]   [id number] Left 1 Implanted   round plate (top and bottom) unknown make      Left 4 Explanted   SEALANT FLOSEAL 10ML HT FAST-PREP   [id number] - [id number] Other SEALANT FLOSEAL 10ML HT FAST-PREP   [id number]   [id number] Left 1 Implanted   COVER BURR HOLE 14MM UNIV NEURO III   [id number] - [id number] Craniio-Facial COVER BURR HOLE 14MM UNIV NEURO III   [id number]    Left 5 Implanted   PLATE NEURO 2HL Julian Reil III  [id number] - [id number] Craniio-Facial PLATE NEURO 2HL RIGID UNIV NEURO III  [id number]    Left 1 Implanted   SCREW BONE UNIV NEURO AXS 1.5X4MM LP SELF-DRILL (ORDER BY 5EA)   [id number] - [id number] Craniio-Facial SCREW BONE UNIV NEURO AXS 1.5X4MM LP SELF-DRILL (ORDER BY 5EA)   [id number]    Left 17 Implanted        Specimens:   Specimens (From admission, onward)             Surgical Pathology  RELEASE UPON ORDERING     Additional Information Start Time Order ID Status   Source: Other(specify)     2020/01/31 1139 [id number] Sent      Question:  Special Specimen Processing Needs  Answer:  None                     Drains: none    Disposition and Plan: TCU    Postop Debrief Done? Yes     I discussed the operative course and postoperative plan with the bedside nurse.    Adriana Simas, MD  01/31/20

## 2020-01-20 NOTE — Interval H&P Note (Signed)
ATTENDING SURGEON PREOPERATIVE NOTE  Terry [name] is a 54 y.o. male with the following pre-op diagnosis: Pre-Op Diagnosis Codes:     * Glioblastoma (CMS code) [C71.9]    Planned procedure: left awake language and sensorimotor mapping craniotomy  Indications:  glioblastoma    INTERVAL HISTORY AND PHYSICAL  The patient's history and physical were reviewed.  I have performed an appropriate, condition-specific physical examination today, and the indication still exists for surgery.     There are no changes in the patients health history, review of systems, or physical findings since the previously recorded history and physical.    Physical exam done today: ao x3, eomi, right PD and face asymmetry, names 4/4 objects    [place]  I have discussed the risks, benefits, and alternatives of the procedure with the patient and/or the patient's medical decision-maker.  This discussion included, but was not limited to, the risk of bleeding, infection, weakness, paralysis, hemorrhage, stroke, seizures, headaches, vision changes, cognitive changes, wound healing complications, worsening of baseline aphasia, hemisensory loss, damage to anatomical structures, need for reoperation, or even death.  The patient and/or the patient's medical decision maker understands, has had all of his/her questions answered, and desires to proceed.  Informed consent obtained.

## 2020-01-21 ENCOUNTER — Inpatient Hospital Stay: Admit: 2020-01-21 | Discharge: 2020-01-21 | Payer: PRIVATE HEALTH INSURANCE

## 2020-01-21 DIAGNOSIS — R9431 Abnormal electrocardiogram [ECG] [EKG]: Secondary | ICD-10-CM

## 2020-01-21 LAB — COMPLETE BLOOD COUNT WITH DIFFERENTIAL
Abs Basophils: 0.01 10*9/L (ref 0.00–0.10)
Abs Eosinophils: 0.02 10*9/L (ref 0.00–0.40)
Abs Imm Granulocytes: 0.1 10*9/L — ABNORMAL HIGH (ref <0.10)
Abs Lymphocytes: 0.76 10*9/L — ABNORMAL LOW (ref 1.00–3.40)
Abs Monocytes: 1.53 10*9/L — ABNORMAL HIGH (ref 0.20–0.80)
Abs Neutrophils: 10.65 10*9/L — ABNORMAL HIGH (ref 1.80–6.80)
Hematocrit: 45.8 % (ref 41.0–53.0)
Hemoglobin: 14.8 g/dL (ref 13.6–17.5)
MCH: 33 pg (ref 26.0–34.0)
MCHC: 32.3 g/dL (ref 31.0–36.0)
MCV: 102 fL — ABNORMAL HIGH (ref 80–100)
Platelet Count: 133 10*9/L — ABNORMAL LOW (ref 140–450)
RBC Count: 4.48 10*12/L (ref 4.40–5.90)
WBC Count: 13.1 10*9/L — ABNORMAL HIGH (ref 3.4–10.0)

## 2020-01-21 LAB — BASIC METABOLIC PANEL (NA, K, CL, CO2, BUN, CR, GLU, CA)
Anion Gap: 6 (ref 4–14)
Calcium, total, Serum / Plasma: 8.5 mg/dL (ref 8.4–10.5)
Carbon Dioxide, Total: 26 mmol/L (ref 22–29)
Chloride, Serum / Plasma: 107 mmol/L (ref 101–110)
Creatinine: 1.2 mg/dL (ref 0.73–1.24)
Glucose, non-fasting: 100 mg/dL (ref 70–199)
Potassium, Serum / Plasma: 4.4 mmol/L (ref 3.5–5.0)
Sodium, Serum / Plasma: 139 mmol/L (ref 135–145)
Urea Nitrogen, Serum / Plasma: 28 mg/dL — ABNORMAL HIGH (ref 7–25)
eGFR - high estimate: 78 mL/min (ref >59)
eGFR - low estimate: 68 mL/min (ref >59)

## 2020-01-21 LAB — ECG 12-LEAD
Atrial Rate: 61 {beats}/min
Calculated P Axis: 44 degrees
Calculated R Axis: -9 degrees
Calculated T Axis: 22 degrees
P-R Interval: 142 ms
QRS Duration: 92 ms
QT Interval: 390 ms
QTcb: 392 ms
Ventricular Rate: 61 {beats}/min

## 2020-01-21 LAB — TROPONIN I: Troponin I: <0.02 ug/L (ref 0.00–0.04)

## 2020-01-21 LAB — MAGNESIUM, SERUM / PLASMA: Magnesium, Serum / Plasma: 2 mg/dL (ref 1.6–2.6)

## 2020-01-21 MED ORDER — SENNOSIDES 8.6 MG TABLET
8.6 | Freq: Every evening | ORAL | Status: AC | PRN
Start: 2020-01-21 — End: ?

## 2020-01-21 MED ORDER — HYDRALAZINE 20 MG/ML INJECTION SOLUTION
20 mg/mL | INTRAMUSCULAR | Status: DC | PRN
  Administered 2020-01-21 – 2020-01-24 (×7): 10 mg via INTRAVENOUS

## 2020-01-21 MED ORDER — OXYCODONE 5 MG TABLET
5 mg | ORAL | Status: DC | PRN
  Administered 2020-01-21 (×2): 10 mg via ORAL

## 2020-01-21 MED ORDER — BISACODYL 10 MG RECTAL SUPPOSITORY
10 | Freq: Every day | RECTAL | Status: DC | PRN
Start: 2020-01-21 — End: 2020-01-25
  Administered 2020-01-22: 10 mg via RECTAL

## 2020-01-21 MED ORDER — TRAZODONE 50 MG TABLET
50 | Freq: Every evening | ORAL | Status: DC | PRN
Start: 2020-01-21 — End: 2020-01-25
  Administered 2020-01-25: 04:00:00 100 mg via ORAL

## 2020-01-21 MED ORDER — LANSOPRAZOLE 30 MG CAPSULE,DELAYED RELEASE
30 | Freq: Every day | ORAL | Status: DC
Start: 2020-01-21 — End: 2020-01-25
  Administered 2020-01-21 – 2020-01-25 (×5): 30 mg via ORAL

## 2020-01-21 MED ORDER — OXYCODONE 5 MG TABLET
5 | ORAL | Status: DC | PRN
Start: 2020-01-21 — End: 2020-01-25
  Administered 2020-01-22 – 2020-01-23 (×5): 10 mg via ORAL
  Administered 2020-01-24 – 2020-01-25 (×3): 15 mg via ORAL
  Administered 2020-01-25: 18:00:00 10 mg via ORAL

## 2020-01-21 MED ORDER — DEXAMETHASONE 2 MG TABLET
2 | Freq: Two times a day (BID) | ORAL | Status: DC
Start: 2020-01-21 — End: 2020-01-25
  Administered 2020-01-23 – 2020-01-25 (×5): 3 mg via ORAL

## 2020-01-21 MED ORDER — POLYETHYLENE GLYCOL 3350 17 GRAM ORAL POWDER PACKET
17 | Freq: Every day | ORAL | 1.00 refills | 30.00000 days | Status: AC | PRN
Start: 2020-01-21 — End: 2021-01-09

## 2020-01-21 MED ORDER — LEVETIRACETAM 500 MG TABLET
500 | ORAL_TABLET | Freq: Two times a day (BID) | ORAL | 0 refills | Status: DC
Start: 2020-01-21 — End: 2020-01-24

## 2020-01-21 MED ORDER — DEXAMETHASONE 1 MG TABLET
1 | ORAL_TABLET | ORAL | 0 refills | Status: AC
Start: 2020-01-21 — End: ?

## 2020-01-21 MED ORDER — POLYETHYLENE GLYCOL 3350 17 GRAM ORAL POWDER PACKET
17 | Freq: Every day | ORAL | Status: DC
Start: 2020-01-21 — End: 2020-01-25
  Administered 2020-01-22 – 2020-01-25 (×4): 17 g via ORAL

## 2020-01-21 MED ORDER — OXYCODONE 5 MG TABLET
5 | ORAL | Status: DC | PRN
Start: 2020-01-21 — End: 2020-01-25

## 2020-01-21 MED ORDER — GADOTERATE MEGLUMINE 0.5 MMOL/ML (376.9 MG/ML) INTRAVENOUS SOLUTION
0.5 | Freq: Once | INTRAVENOUS | Status: AC
Start: 2020-01-21 — End: 2020-01-21
  Administered 2020-01-21: 17:00:00 20 mL/kg via INTRAVENOUS

## 2020-01-21 MED ORDER — AMLODIPINE 10 MG TABLET
10 mg | ORAL | Status: DC
  Administered 2020-01-21 – 2020-01-25 (×5): 10 mg via ORAL

## 2020-01-21 MED ORDER — CAPTOPRIL 50 MG TABLET
50 mg | ORAL | Status: DC
  Administered 2020-01-21 – 2020-01-22 (×4): 50 mg via ORAL

## 2020-01-21 MED ORDER — LABETALOL 5 MG/ML INTRAVENOUS SOLUTION
5 mg/mL | INTRAVENOUS | Status: DC | PRN
  Administered 2020-01-21 – 2020-01-22 (×3): 10 mg via INTRAVENOUS

## 2020-01-21 MED ORDER — ACETAMINOPHEN 500 MG TABLET
500 | Freq: Three times a day (TID) | ORAL | Status: AC | PRN
Start: 2020-01-21 — End: ?

## 2020-01-21 MED ORDER — OXYCODONE 5 MG TABLET: 5 mg | ORAL | Status: DC | PRN

## 2020-01-21 MED ORDER — OMEPRAZOLE 20 MG CAPSULE,DELAYED RELEASE
20 | ORAL | Status: AC
Start: 2020-01-21 — End: ?

## 2020-01-21 MED FILL — DILAUDID (PF) 0.5 MG/0.5 ML INJECTION SYRINGE: 0.5 mg/ mL | INTRAMUSCULAR | Qty: 0.5

## 2020-01-21 MED FILL — AMLODIPINE 10 MG TABLET: 10 mg | ORAL | Qty: 1

## 2020-01-21 MED FILL — HYDRALAZINE 20 MG/ML INJECTION SOLUTION: 20 mg/mL | INTRAMUSCULAR | Qty: 1

## 2020-01-21 MED FILL — CEFAZOLIN 2G/20 ML NS INTRAVENOUS SYRINGE: 2 g/0 mL | INTRAVENOUS | Qty: 20

## 2020-01-21 MED FILL — LANSOPRAZOLE 30 MG CAPSULE,DELAYED RELEASE: 30 mg | ORAL | Qty: 1

## 2020-01-21 MED FILL — OXYCODONE 5 MG TABLET: 5 mg | ORAL | Qty: 1

## 2020-01-21 MED FILL — SENNA LAX 8.6 MG TABLET: 8.6 mg | ORAL | Qty: 2

## 2020-01-21 MED FILL — CAPTOPRIL 50 MG TABLET: 50 mg | ORAL | Qty: 1

## 2020-01-21 MED FILL — ACETAMINOPHEN 500 MG TABLET: 500 mg | ORAL | Qty: 2

## 2020-01-21 MED FILL — OXYCODONE 5 MG TABLET: 5 mg | ORAL | Qty: 2

## 2020-01-21 MED FILL — LEVETIRACETAM 500 MG TABLET: 500 mg | ORAL | Qty: 1

## 2020-01-21 MED FILL — DEXAMETHASONE 4 MG TABLET: 4 mg | ORAL | Qty: 1

## 2020-01-21 MED FILL — LABETALOL 5 MG/ML INTRAVENOUS SOLUTION: 5 mg/mL | INTRAVENOUS | Qty: 20

## 2020-01-21 NOTE — Discharge Instructions - Pharmacy (Signed)
Dexamethasone (Decadron) 1 mg tablet        Taper as directed per attached calendar.     Please be sure to take the correct number tablets that corresponds to your dosage on the calendar.   For example: Each tablet contains dexamethasone 1 mg, so if the dose is ? 4 mg, you need to take 4 tablets. If the dose is ? 2 mg, you need to take 2 tablets, and so forth.      *Take in evenly spaced doses throughout the day with food to prevent stomach upset  **Do not take near bedtime may keep you awake    CALL YOUR DOCTOR [phone]) for symptoms of increased pressure inside your head:  1) Headaches that are not controlled with pain medication  2) Increasing sedation  3) Increasing confusion  4) Increasing weakness  5) Blurred vision  6) Slurred speech       Patient Name: [name], Terry    Discharge Date: January-20     September        Dexamethasone Taper Schedule  ** 1 mg Dexamethasone Tablets ** Take with Food **   Sunday Monday Tuesday Wednesday Thursday Friday Saturday    August 29 August 30 August 31 September 1 September 2 September 3 September 4       Surgery ? 4 mg  ? 4 mg ? 4 mg  ? 4 mg   September 5 September 6 September 7 September 8 September 9 September 10 September 11   ? 3 mg  ? 3 mg ? 3 mg  ? 3 mg ? 3 mg  ? 3 mg ? 2 mg  ? 2 mg ? 2 mg  ? 2 mg ? 2 mg  ? 2 mg ? 1 mg  ? 1 mg   September 12 September 13 September 14 September 15 September 16 September 17 September 18   ? 1 mg  ? 1 mg ? 1 mg  ? 1 mg ? 1 mg  *Take in the morning ? 1 mg  *Take in the morning ? 1 mg  *Take in the morning ? 0.5 mg   *Take in the morning ? 0.5 mg   *Take in the morning   September 19 September 20 September 21 September 22 September 23 September 24 September 25   Stop taking, unless directed by surgeon

## 2020-01-21 NOTE — Consults (Signed)
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    ASSESSMENT   Pt presents with PT impairments/problems in: aerobic capacity/ endurance, pain, balance, strength, cardiovascular pump dsyfunction  Primary PT impairments/problems detail: Upon arrival to room, patient received sitting up in chair. BP 185/94 and outside of recommended parameters. Informed RN immediately and received clearance to return pt from chair to bed at this time for BP mgmt. Close sup to take steps bed to chair this session. BP inc to 186/102 in bed. Pt c/o ongoing and stable headache throughout session. Pt also reporting some chest tightness, HR 64-74 bpm throughout session. RN informed immediately. Mobility limited this session primarily due to hypertension and pain. Recommend ongoing skilled PT to maximize pt safety and independence with mobility.  Resulting in: risk for falls  Anticipate patient can progress to: Ind amb with no AD    RECOMMENDATIONS  DISCHARGE RECOMMENDATION  Comments  Home with outpatient PT   versus no PT based upon progress and further assessment.    Patient Current Functional Status Sufficient for PT Discharge Recommendation  No   Discharge DME needs  TBD   Discharge transportation needs  Private car   Rehab Potential  Patient participates well in therapy and progressing towards goals     NURSING RECOMMENDATIONS  Inpatient Rehab Assistive Device Recommendation  None   Activity Recommendation  OOB for meals 3x/day with one person A if medically appropriate.     PRIOR LEVEL OF FUNCTION  Prior Living Environment:  Pt lives in 2 level home with 3 STE and 12 stairs inside. Has rails for all stairs.  Available equipment or existing home modifications:  None. Has tub shower.  Prior functional limitations:  IADL  Social supports:  Lives with wife and 42 year old son. Per pt report, wife available to assist 24/7 upon dc.  Community access:   Family/friends  Fall History: Yes  Fall history details: Pt reports fall last Thursday when he tripped over something. Denies any other falls prior to that..    SUBJECTIVE  Subjective report:   Pt agreeable for PT.  Patient goals:   to return home  Notable observations:   Pt received in chair and left resting in bed with needs in reach, call bell in reach. RN immediately notified regarding vitals and pt symptoms.    SYSTEMS REVIEW  Cognition/Communication  Delirium screen:  No  Cognition/communication impaired:  No  Integumentary  Integumentary deficits:  Yes  Integumentary deficit detail:  head wrap c/d/i  Cardiopulmonary  Cardiopulmonary deficits:  Yes  Detail:  dec activity tolerance due to HTN  Musculoskeletal  Musculoskeletal deficits:  Yes  Abnormal strength findings: BLEs at least 3+/5. Unable to formally assess due to HTN and symptoms.  Abnormal range of motion findings: WFL assessed with functional mobility  Neuromuscular  Neuromuscular deficits:  No (pt reports BLE sensation intact)    Pain     Currently in pain: Yes  Pain location: psot op head pain  Pain scale: Numeric scale  Alleviators/aggravators/intervention: RN notified.  Pain Level Upon Arrival: 7  Pain Level End of Therapy: 7       FUNCTIONAL MOBILITY  Precautions/WB status:  Yes  Precautions and weight bearing status comments: Low Light Exposure Precautions; SBP<140; HOB>30    Requires second person/additional health care providers: No    Bed mobility  Activity  Level of assist Current Device  Rolling     Supine < > Sit  Supervision  Head of bed elevated   Rolling:    Supine< >Sit: Bed mobility intervention: Verbal cues    Transfer  Activity  Level of assist Current Device Technique   Sit < > Stand  Supervision  None    Transfer  From:  To:      Sit < > Stand: Intervention: Verbal cues, Comments: close sup  Transfer:      Locomotion  Activity  Level of assist Current Device Distance/Steps   Gait  Supervision  None  x5 ft chair to bed with close  supervision   Stairs      Gait:    Stairs:        Balance  Balance deficits noted:  Yes  Functional Balance for ADLs  Position Static Dynamic   Sit  -      -        Stand Static Standing level of assist: Supervision-   Static standing comment: close sup Dynamic Standing level of assist: Supervision-  Dynamic standing comment: close sup       Exercise/other interventions  Supine exercises comment (other exercise, details): Pt educated on completing ankle pumps when BP better managed to dec risk of blood clots and dec deconditoining. Pt verbalizes understanding.  Other exercise: Pt educated on OOB for meals 3x/day when BP managed and medically appropriate with nursing team to prevent deconditioning. RN notified. Pt verbalizes understanding.       Education Careers adviser: Patient  Content: Plan of care, Discharge recommendations, Activity recommendations  Response: Verbalizes understanding    Communication between other health care providers:  OT, RN  Communication comment: pt status    Outcome measures   Physical Therapist Global Assessment of Mobility  Activity Achieved: Walking/Wheelchair 5-261ft with any level of assistance, with or without assist device  AMPAC 6-clicks basic mobility score:  18      PLAN  PT frequency:  4x/week  PT duration:  3 weeks.  Comment:  POC ends 9/24    GOALS  Flowsheet Rows      Most Recent Value   Patient will perform supine< >sitting  MOD I with LRAD   Patient will perform bed to chair transfers  I   Patient will ambulate  >100 ft I   Patient will ascend/descend flight of stairs  12 steps with MOD I and LRAD   Patient/cargiver will be independent  Walking program        Planned PT interventions:   Specific interventions:  Progressive functional mobility training, Gait training, Balance training, NM Re-ed, Aerobic training, Ther ex  Education interventions:  (P) Mobility with precautions compliance, Fall risk reduction, Exercise program, Self-pacing/breathing, Benefits of  activity, Caregiver training  Comment:     Terry [name], PT    01/21/2020

## 2020-01-21 NOTE — Plan of Care (Addendum)
AM pages incorrectly routed to NP who is OOT.    Later notified by NP via phone call about pages regarding "chest pressure" and need for more HTN prns.    Ordered PRNs, labs, ekg.   Trop neg, NSR.     Chest pressure improved shortly thereafter. Headache also improved. "I'm feeling much better now actually."    Neuro exam at stated baseline.    Plan  -continue to monitor      Addenda:  -received page patient c/o gurgling sound in left ear  -examined patient at bedside, at baseline neuro exam  -reassured pt sound will subside as healing/swelling improves

## 2020-01-21 NOTE — Consults (Signed)
CLINICAL PHARMACY DISCHARGE NOTE       Service  : Neurosurgery Crani    Patient Name:  Terry [name]  Patient MRN:  [id number]    Pharmacy:   [facility] [place] [address]  Phone: [phone] Fax: [place], [address]  [place] [address]  Phone: [phone] Fax: [phone]      Medications upon discharge:      Daily Medication List      Take these medications at their scheduled times      Comments   amLODIPine 10 mg tablet  Commonly known as: NORVASC  Notes to patient: FOR: Blood pressure control. May cause dizziness, fatigue or swelling of the feet/ankles    Note: This is a NEW medication started during your admission at [facility]. Please follow up with your primary care doctor for monitoring.         Instructions: Take 1 tablet (10 mg total) by mouth daily     docusate sodium 100 mg capsule  Commonly known as: COLACE         Instructions: Take 100 mg by mouth in the morning and at bedtime     levETIRAcetam 1,000 mg tablet  Commonly known as: KEPPRA  Notes to patient: FOR: seizure prevention. May cause dizziness, drowsiness or stomach upset. Avoid alcohol. Report intolerable behavioral problems such as aggression, agitation, anger or irritability to your physician.         Instructions: Take 1 tablet (1,000 mg total) by mouth 2 (two) times daily     lisinopriL 20 mg tablet  Commonly known as: PRINIVIL,ZESTRIL  Notes to patient: FOR: Blood pressure control. May cause dizziness, dry cough or increased urinary frequency. It is recommended that you have your kidney function checked at least once a year while taking this medicine. May change potassium levels    Note: This is a NEW medication started during your admission at [facility]. Please follow up with your primary care doctor for monitoring.         Instructions: Take 1 tablet  (20 mg total) by mouth daily     omeprazole 20 mg capsule  Commonly known as: PRILOSEC  Notes to patient: FOR: Prevention of ulcers. Best taken on an empty stomach (e.g., at least one hour before eating). May interfere with the absorption of some vitamins, calcium and some antibiotics         Instructions: Take 1 capsule (20 mg total) by mouth every morning to prevent stomach upset while you are taking dexamethasone.        Take these medications as needed      Comments   acetaminophen 500 mg tablet  Commonly known as: TYLENOL  Notes to patient: FOR: mild pain or fever. Do not take more than 4000 mg of acetaminophen per day in all medicines containing this ingredient. Avoid alcohol         Instructions: Take 2 tablets (1,000 mg total) by mouth every 8 (eight) hours as needed (mild pain)     LORazepam 0.5 mg tablet  Commonly known as: ATIVAN         Instructions: Take 0.5 mg by mouth every 6 (six) hours as needed for Anxiety 0.5mg  to 1 mg prn     naloxone 4  mg/actuation Spraynaero  Notes to patient: FOR: Opioid reversal (if too drowsy/unresponsive or experiencing slowed or labored breathing). May repeat every 2 to 3 minutes in alternating nostrils until medical assistance becomes available. Use as instructed. If used, 911 should be called for further medical care.         Instructions: 1 spray by Nasal route once as needed (suspected overdose) for up to 1 dose Call 911. Repeat if needed     ondansetron 4 mg tablet  Commonly known as: ZOFRAN  Notes to patient: FOR: nausea. May cause headache or dizziness. May cause changes in heart rhythm, particularly in combination with some pain medicines, antibiotics or drug for mood.         Instructions: Take 1 tablet (4 mg total) by mouth every 6 (six) hours as needed for Nausea for up to 21 days     oxyCODONE 5 mg tablet  Commonly known as: ROXICODONE  Notes to patient: May be taken with acetaminophen (Tylenol) for better pain relief. May cause drowsiness, dizziness, stomach  upset or constipation. Take with food. Avoid alcohol. May cause physical or psychological dependence. It is recommended that you keep naloxone (a reversal agent) on hand at home in case you ever respond too strongly to this medicine, such as reduced breathing depth or frequency.         Instructions: Take 1-2 tablets (5-10 mg total) by mouth every 6 (six) hours as needed for Pain (moderate to severe pain)     polyethylene glycol 17 gram packet  Commonly known as: MIRALAX  Notes to patient: FOR: Constipation. Dissolve powder in 8 ounces of water, juice, cola, or tea. Do not take if you are experiencing loose stools         Instructions: Take 17 g by mouth daily as needed (constipation)     senna 8.6 mg tablet  Commonly known as: SENOKOT  Notes to patient: FOR: constipation. Hold if you have loose stools. Drink plenty of fluids.         Instructions: Take 2 tablets (17.2 mg total) by mouth nightly as needed for Constipation     traZODone 50 mg tablet  Commonly known as: DESYREL         Instructions: Take 100 mg by mouth nightly as needed (Insomnia)         Take these medications as directed      Comments   dexAMETHasone 1 mg tablet  Commonly known as: DECADRON  Notes to patient: Steroid for swelling. Take in evenly spaced throughout the day. CALL YOUR DOCTOR for Symptoms of: Headaches that are not controlled with pain medicine; Sedation, confusion, weakness; Vision or speech changes         Instructions: Take with food by mouth as directed by taper calendar. Last day on 02/05/20  Additional Comments: 9/3-9/18 oral taper (started in hospital, anticipated discharge 9/4): 4mg  Q12h x2days, 3mg  Q12h x3days, 2mg  Q12h x3days, 1mg  Q12h x3days, 1mg  Qam x3days, 0.5mg  Qam x2days, then stop        Medication Instructions:     Dexamethasone (Decadron) 1 mg tablet        Taper as directed per attached calendar.     Please be sure to take the correct number tablets that corresponds to your dosage on the calendar.   For example: Each  tablet contains dexamethasone 1 mg, so if the dose is ? 4 mg, you need to take 4 tablets. If the dose is ? 2 mg, you need  to take 2 tablets, and so forth.      *Take in evenly spaced doses throughout the day with food to prevent stomach upset  **Do not take near bedtime may keep you awake    CALL YOUR DOCTOR [phone]) for symptoms of increased pressure inside your head:  1) Headaches that are not controlled with pain medication  2) Increasing sedation  3) Increasing confusion  4) Increasing weakness  5) Blurred vision  6) Slurred speech       Patient Name: Terry [name], Terry [name]    Discharge Date: January-20     September        Dexamethasone Taper Schedule  ** 1 mg Dexamethasone Tablets ** Take with Food **   Sunday Monday Tuesday Wednesday Thursday Friday Saturday    August 29 August 30 August 31 September 1 September 2 September 3 September 4       Surgery ? 4 mg  ? 4 mg ? 4 mg  ? 4 mg   September 5 September 6 September 7 September 8 September 9 September 10 September 11   ? 3 mg  ? 3 mg ? 3 mg  ? 3 mg ? 3 mg  ? 3 mg ? 2 mg  ? 2 mg ? 2 mg  ? 2 mg ? 2 mg  ? 2 mg ? 1 mg  ? 1 mg   September 12 September 13 September 14 September 15 September 16 September 17 September 18   ? 1 mg  ? 1 mg ? 1 mg  ? 1 mg ? 1 mg  *Take in the morning ? 1 mg  *Take in the morning ? 1 mg  *Take in the morning ? 0.5 mg   *Take in the morning ? 0.5 mg   *Take in the morning   September 19 September 20 September 21 September 22 September 23 September 24 September 25   Stop taking, unless directed by surgeon                     Patient to discontinue the use of the following medications/supplements for at least 2 weeks post-op:   1. NSAIDs and aspirin   2. herbal supplements    Reviewed the following with Neurosurgery:  1. Steroid: as d/w MD [place] - dexamethasone to be tapered off over 2 weeks. Reviewed taper calendar. GI ppx with PTA omeprazole.  2. Anticonvulsant: per MD [place] - pt to cont on Levetiracetam 1000 mg BID for 90 days for  seizure prophylaxis.   3. Pain management: continue acetaminophen prn mild pain and oxycodone prn moderate or severe pain and start polyethylene glycol and senna to prevent constipation from pain medications. All opiates and narcotics can be habit forming and cause addiction. Pt should use the lowest effective dose for the shortest amount of time as possible.  4. Nausea: continue Ondansetron prn nausea and vomiting.  5. HTN: per CNS hospitalist- continue new amlodipine 10mg  daily and lisinopril 20mg  daily. Patient to establish care with PCP and follow up for monitoring.    *CURES database was reviewed and consistent with expected prescribing.  I reviewed the patients substance use history as well as current medication list.  Patient was prescribed naloxone. Use of naloxone was reviewed with the patient and/or caregiver and educational materials were provided.    *I have compared the pre-admission medication list to the discharge medications and any differences have been reconciled.    *MD to review any additional modifications prior  to discharge and inform pt of any changes. Bedside RN and/or MD to review discharge medications with patient and/or caregiver(s). After visit summary updated in anticipation of weekend discharge.     *Secure Rx for oxycodone 5 mg (#30, 0RF) placed in patient's chart.     Claudette Head, PharmD  Clinical Pharmacist, Neurological Surgery

## 2020-01-21 NOTE — Consults (Signed)
Rapid Response Team Consult Note    Patient Name: Terry [name]  MRN#: [id number]  Time Notified: 1110  Room #: U235/T614-43    Event Summary:  89M with multifocal glioma now s/p L crani for tumor resection 9/2.   RRT c/s for elevated SBP despite Labetalol, chest tightness and oozing from head dressing.   On exam, A/Ox4, c/o feeling tired.  Neuro exam at baseline (intact expect for sl decreased sensation R upper/lower). Pupils 57mm equal and reactive.   Chest tightness described as bandlike and states it is the same "constriction" feeling he gets with his Decadron at home for ~40mo now. EKG NSR w/o ischemic changes.   BP had been as high as 180s with mild improvement to the 160s after several doses of Labetalol and Dilaudid. Now 130s after Hydralazine but headache/head pressure persists 7/10.   Increased oozing from head dressing as well.   Discussed plan with NS, Med [place], BSRN.   NS to eval, review MRI from this am.   Continue to monitor in TCU.  RRT available as needed.     Rapid Response Event  Rapid Response RN: Evette Cristal (01/21/20 1220)  Rapid Response RT: Shellia Carwin (01/21/20 1220)         Primary Reason for Call  Primary Reason for Call: Other medical concern (01/21/20 1220)              Rapid Response Summary  Patient Disposition: Same level of care (01/21/20 1220)  Total Time Spent (Minutes): 40 minutes (01/21/20 1220)

## 2020-01-21 NOTE — Interdisciplinary (Signed)
Discharge Screening Note     Primary Diagnosis: Brain Tumor  Surgery/Procedures/Therapies: s/p L crani for tumor resection   Current  Status: Trace R hemibody weakness (baseline) otherwise nonfocal  Rehabilitation Evaluation Ordered:  yes  Rehabilitation Evaluation Recommendation: HOME  Estimated D/C Date: Pending medical stability  Barriers to Discharge: Medically Active  Discharge Plan: Pending  Identified Case Management Interventions: None identified, at this time     Elective Admit  Lives in [place]. Family.  Registers with [place]  CM following for evolving DC needs

## 2020-01-21 NOTE — Consults (Signed)
BIPAP/CPAP Safety Assessment    Patient Name: Terry [name]  MRN#: [id number]  Room #: S886/Y847-20    Admit Date: 01/20/2020  Operative Date: 01/20/20    BiPAP/CPAP Device and Settings:  [facility] Unit       Last filed Vital Signs (within 4 hrs):  Temp: 36.4 C (01/20/20 2320)  Heart Rate: (!) 56 (01/21/20 0248)  *Resp: 18 (01/21/20 0248)  BP: 129/71 (01/20/20 2320)  SpO2: 97 % (01/21/20 0248)    Bedside evaluation:  Neuro Status: Alert and Oriented x4  Opiate/Sedative Use: Dilaudid PRN, Oxycodone PRN  Monitoring in place: pt on CPO  Pt is capable of removing mask as needed? Yes  Patient is appropriate to remain at current level of care on AC/TCU.     Device is plugged in to red electrical safety outlet? Yes  Alarms are on and set? Does not apply to this home device    Primary RT and RN to follow patient throughout the night per standard of care.    Comments: Patient has a history of OSA, hypertension, and hyperlipidemia. He is compliant with his CPAP usage at home. He is on 6L post-operatively for Gliobalastoma.  Total Time Spent (Minutes):  20

## 2020-01-21 NOTE — Consults (Signed)
OCCUPATIONAL THERAPY CANCELED SESSION NOTE     OT session missed for the following reason(s): Medically unstable  Missed/Cancelled Session Comment: Attempted OT evaluation, PT recently finishing w/ noted hypertension outside of parameters with seated OOB up in chair. Hold at this time. Follow up as able.                  Quitman Livings, OT  01/21/20

## 2020-01-21 NOTE — Progress Notes (Signed)
[facility] Course  9/2 - to OR    Subjective  Resting    Vitals  Temp:  [35.5 C (95.9 F)-36.7 C (98.1 F)] 36.7 C (98.1 F)  Heart Rate:  [46-60] 51  *Resp:  [11-18] 16  BP: (102-145)/(59-87) 128/71  Arterial Line BP (mmHg) : (105-118)/(46-57) 111/57  SpO2:  [93 %-99 %] 96 %    Input / Output  I/O last 2 completed shifts plus current shift:  In: 3940 [P.O.:590; I.V.:3250; IV Piggyback:100]  Out: 750 [Urine:700; Blood:50]  Indwelling Urinary Catheter 01/20/20 16 (Active)   Number of days: 0       Physical Exam  AOx3  Speech fluent, no paraphasic errors  PERRL, EOMI  V1-V3 intact  Face =   Hearing intact  TML    No pronator drift  Trace R hemibody weakness (baseline)    Recent Labs     01/20/20  1339 01/19/20  1356   WBC 13.1* 13.8*   HGB 14.2 16.0   HCT 44.0 49.8   PLT 160 185   NA 135 141   K 4.7 4.5   CL 105  --    CO2 20*  --    BUN 31*  --    CREAT 1.23 1.22   GLU 175 114   CA 8.1*  --    PT 13.7 13.3   INR 1.1 1.1         Problem-based Assessment & Plan  Terry [name] [id number] [phone]   24M with multifocal glioma now s/p L crani for tumor resection 9/2.   Exam: Trace R hemibody weakness (baseline) otherwise nonfocal.  - TCU  - SBP<140  - WBC 13.1 Hgb 14.2 Plt 160 INR 1.1  - keppra 500 bid, decadron 4 bid  - no drain  - ALA low light precautions x 48 hours  - postop MRI w/ DTI pending  - Neuro-oncology consult, appreciate recs  Summary: POD#1 s/p L crani for tumor resection, exam at baseline  Plan: follow up post op MRI, decadron 4 bid taper off over 2 weeks, keppra 500 bid, mobilize with PT/OT.      No new Assessment & Plan notes have been filed under this hospital service since the last note was generated.  Service: Neurosurgery         During this hospitalization the patient is being treated for:  Glioblastoma    Code Status: FULL    Adriana Simas, MD  01/20/2020

## 2020-01-21 NOTE — Consults (Addendum)
CLINICAL PHARMACY ADMIT NOTE           Date: 01/21/20      Name: Terry [name], [id number]     Preferred Pharmacy:   [facility] [address] [place] [address]  Phone: [phone] Fax: [facility], [place] [address]  [place] [address]  Phone: [phone] Fax: [phone]        History taken per: patient interview, pharmacy fill history, CURES     Allergies/Contraindications  No Known Allergies     Medications (Dose/Route/Frequency):      Medications Prior to Admission   Medication Sig Note    dexAMETHasone (DECADRON) 4 mg tablet Take 4 mg by mouth Twice a day        docusate sodium (COLACE) 100 mg capsule Take 100 mg by mouth in the morning and at bedtime     levETIRAcetam (KEPPRA) 500 mg tablet Take 500 mg by mouth 2 (two) times daily     LORazepam (ATIVAN) 0.5 mg tablet Take 0.5 mg by mouth every 6 (six) hours as needed for Anxiety 0.5mg  to 1 mg prn 01/21/2020: Does not take everyday    omeprazole (PRILOSEC) 20 mg capsule Take 20 mg by mouth every morning 01/21/2020: Had not started yet, was planning to start taking for steroids gi ppx    traZODone (DESYREL) 50 mg tablet Take 100 mg by mouth nightly as needed (Insomnia)    01/21/2020: Has only taken 2x so far    oxyCODONE (ROXICODONE) 5 mg tablet Take 5 mg by mouth every 4 (four) hours as needed for Pain          Discrepancies:   -Temozolomide-prescribed in [place], but has not started  -Added oxycodone  -corrected trazodone formulation and frequency    [X]  I have compared and reconciled the list of medications in the physician H&P and those prescribed at admission and I have the following recommendations:     Recommendations:     1. Medication Reconciliation:   Continue home maintenance medications except for    Omeprazole-formulary substitution with lansoprazole   Hold  lorazepam-doesn't take everyday   Hold off OTC supplements/herbals while in hospital and for 2 weeks postoperatively  2. Steroid Management: continue dexamethasone as written, will f/u with neurosurgery team regarding taper schedule   Recommend lansoprazole while on dexamethasone for GI prophylaxis   Blood glucose most recently 100 mg/dl  3. Pain Management:   APAP 1000 mg PO q8h   Oxycodone 5-15 mg PO q3h PRN moderate pain   Hydromorphone 0.2-0.5 mg IV q3h PRN severe pain in perioperative period   Will continue to follow as patient transitions to oral medications in anticipation of disposition  4. Bowel Regimen:   Senna 17.2mg  PO QPM   Add Miralax 17g PO daily   Add Bisacodyl 10 mg PR daily PRN constipation    Rush Landmark, PharmD  Clinical Pharmacist, Neurological Surgery

## 2020-01-22 LAB — MRSA CULTURE

## 2020-01-22 MED ORDER — LEVETIRACETAM 500 MG/100 ML IN SODIUM CHLORIDE (ISO-OSM) IV PIGGYBACK
500 mg/100 mL | INTRAVENOUS | Status: AC
  Administered 2020-01-22: 23:00:00 500 mg via INTRAVENOUS

## 2020-01-22 MED ORDER — CAPTOPRIL 12.5 MG TABLET: 12.5 mg | ORAL | Status: DC

## 2020-01-22 MED ORDER — LEVETIRACETAM 500 MG TABLET
500 mg | ORAL | Status: DC
  Administered 2020-01-23 – 2020-01-25 (×6): 1000 mg via ORAL

## 2020-01-22 MED ORDER — HEPARIN, PORCINE (PF) 5,000 UNIT/0.5 ML INJECTION SOLUTION
5,000 unit/0.5 mL | INTRAMUSCULAR | Status: DC
  Administered 2020-01-22 – 2020-01-25 (×7): 5000 [IU] via SUBCUTANEOUS

## 2020-01-22 MED ORDER — CAPTOPRIL 50 MG TABLET
50 mg | Freq: Three times a day (TID) | ORAL | Status: AC
Start: 2020-01-22 — End: 2020-01-23
  Administered 2020-01-23 – 2020-01-24 (×4): 62.5 mg via ORAL

## 2020-01-22 MED ORDER — CAPTOPRIL 12.5 MG TABLET
50 | Freq: Three times a day (TID) | ORAL | Status: DC
Start: 2020-01-22 — End: 2020-01-22

## 2020-01-22 MED ORDER — LEVETIRACETAM 250 MG TABLET
500 | Freq: Two times a day (BID) | ORAL | Status: DC
Start: 2020-01-22 — End: 2020-01-22

## 2020-01-22 MED ORDER — CAPTOPRIL 50 MG TABLET: 50 mg | ORAL | Status: DC

## 2020-01-22 MED FILL — DILAUDID (PF) 0.5 MG/0.5 ML INJECTION SYRINGE: 0.5 mg/ mL | INTRAMUSCULAR | Qty: 0.5

## 2020-01-22 MED FILL — CAPTOPRIL 50 MG TABLET: 50 mg | ORAL | Qty: 1

## 2020-01-22 MED FILL — AMLODIPINE 10 MG TABLET: 10 mg | ORAL | Qty: 1

## 2020-01-22 MED FILL — BISACODYL 10 MG RECTAL SUPPOSITORY: 10 mg | RECTAL | Qty: 1

## 2020-01-22 MED FILL — ACETAMINOPHEN 500 MG TABLET: 500 mg | ORAL | Qty: 2

## 2020-01-22 MED FILL — DEXAMETHASONE 4 MG TABLET: 4 mg | ORAL | Qty: 1

## 2020-01-22 MED FILL — HEPARIN, PORCINE (PF) 5,000 UNIT/0.5 ML INJECTION SOLUTION: 5000 unit/0.5 mL | INTRAMUSCULAR | Qty: 0.5

## 2020-01-22 MED FILL — HYDRALAZINE 20 MG/ML INJECTION SOLUTION: 20 mg/mL | INTRAMUSCULAR | Qty: 1

## 2020-01-22 MED FILL — LEVETIRACETAM 500 MG/100 ML IN SODIUM CHLORIDE (ISO-OSM) IV PIGGYBACK: 500 mg/100 mL | INTRAVENOUS | Qty: 100

## 2020-01-22 MED FILL — LEVETIRACETAM 500 MG TABLET: 500 mg | ORAL | Qty: 1

## 2020-01-22 MED FILL — LABETALOL 5 MG/ML INTRAVENOUS SOLUTION: 5 mg/mL | INTRAVENOUS | Qty: 20

## 2020-01-22 MED FILL — SENNA LAX 8.6 MG TABLET: 8.6 mg | ORAL | Qty: 2

## 2020-01-22 MED FILL — OXYCODONE 5 MG TABLET: 5 mg | ORAL | Qty: 2

## 2020-01-22 MED FILL — LANSOPRAZOLE 30 MG CAPSULE,DELAYED RELEASE: 30 mg | ORAL | Qty: 1

## 2020-01-22 MED FILL — HEALTHYLAX 17 GRAM ORAL POWDER PACKET: 17 gram | ORAL | Qty: 1

## 2020-01-22 NOTE — Consults (Signed)
HOSPITAL MEDICINE INITIAL CONSULT NOTE     Consult requested by Dr. Franchot Erichsen of the neurosurgery service for evaluation of difficult to manage post-operative hypertension.    History of Present Illness    54 yo man with multifocal glioblastoma s/p resection with neurosurgery on 9/2. Medical history notable for OSA.      Not on antihypertensives prior to this admission and no known high blood pressure. Started on dexamethasone 4 mg BID preoperatively; plan to taper this medication over the next two weeks.     Goal SBP <140 which has been difficult to achieve. Requires this level of tight blood pressure control for another 1-2 days. Current medications include carvedilol 50 mg Q8H, amlodipine 10 mg (started this morning), PRN hydral IV 10 mg and PRN labetalol IV 10 mg.     Terry [name] feels well, with no headaches, vision changes, chest pain, shortness of breath, or leg swelling. No lightheadedness or presyncope on current antihypertensive regimen. He feels that the driver of his blood pressure spikes is pain.  He has not been using his CPAP during this admission due to EEG leads on his scalp. Previously had shortness of breath with dexamethasone; none now.     Past Medical History:   Diagnosis Date    Glioblastoma (CMS code)      Past Surgical History:   Procedure Laterality Date    BRAIN BIOPSY  12/25/2019     Allergies: Patient has no known allergies.     Scheduled Meds:   0.9% sodium chloride flush  3 mL Intravenous Q8H [place]    acetaminophen  1,000 mg Oral Q8H [place]    amLODIPine  10 mg Oral Daily [place]    captopriL  62.5 mg Oral Q8H [place]    [START ON 01/23/2020] dexAMETHasone  3 mg Oral BID WC [place]    dexAMETHasone  4 mg Oral Q12H [place]    heparin  5,000 Units Subcutaneous Q12H [place]    lansoprazole  30 mg Oral Daily [place]    levETIRAcetam  1,000 mg Oral BID [facility]    polyethylene glycol  17 g Oral Daily [place]    senna  17.2 mg Oral Daily At Bedtime [facility]     Continuous Infusions:  PRN Meds:   0.9% sodium chloride  flush  3 mL Intravenous PRN    bisacodyL  10 mg Rectal Daily PRN    hydrALAZINE  10 mg Intravenous Q2H PRN    HYDROmorphone  0.2-0.5 mg Intravenous Q3H PRN    labetalol  10 mg Intravenous Q2H PRN    naloxone  0.1 mg Intravenous Q1 Min PRN    ondansetron  4 mg Oral Q6H PRN    Or    ondansetron  4 mg Intravenous Q6H PRN    oxyCODONE  5-15 mg Oral Q3H PRN    traZODone  100 mg Oral Bedtime PRN     Social hx:   Lives with wife in [place], [place]  Never smoker, rare alcohol use    Family History was reviewed and is non-contributory to this illness.    Review of Systems   Constitutional: Negative for chills, fever and malaise/fatigue.   HENT: Negative for congestion and sinus pain.    Eyes: Negative for blurred vision and double vision.   Respiratory: Negative for cough and shortness of breath.    Cardiovascular: Negative for chest pain and leg swelling.   Gastrointestinal: Negative for nausea and vomiting.   Musculoskeletal: Negative for falls and myalgias.  Skin: Negative for itching and rash.   Neurological: Negative for dizziness and headaches.   Psychiatric/Behavioral: Negative for memory loss. The patient does not have insomnia.      ROS: (10+) systems documented above in ROS SMARTBLOCK    Vitals  Temp:  [36.7 C (98.1 F)-37.3 C (99.2 F)] 36.7 C (98.1 F)  Heart Rate:  [58-80] 74  *Resp:  [16-20] 17  BP: (119-192)/(54-81) 119/68  SpO2:  [87 %-99 %] 95 %    Pain Score: 5    Physical Exam  Gen: well appearing man in NAD, EEG leads in place on scalp  HEENT: pupils equal, normal size for low room light  CV: Regular rate and rhythm, no murmurs appreciated   Pulm:  Clear to auscultation bilaterally no wheezing   Abd:  Soft, nontender  Extr: no leg swelling.  2+ DP pulse   Skin:  No rashes  Neuro:  Fluent speech, symmetric face, memory intact to medical history and events of this admission  Psych: mood is good, hopeful     Data    Creatinine 1.2, stable from prior (12/26/19 creatinine was 1.1)    Radiology Results    MR Genelle Bal with and without Contrast DTI    Result Date: 01/21/2020  1. Interval gross total resection of enhancing tumor in the left frontoparietal junction. Small foci of cytotoxic edema in the periphery of the resection cavities are expected to enhance on short-term follow-up examination. 2. Abnormal FLAIR signal deep to the left frontal biopsy cavity is persistent and unchanged since preoperative imaging. Report dictated by: Quenton Fetter, MD, signed by: Zorita Pang, MD PhD Department of Radiology and Biomedical Imaging    EKG: from 9/3; normal sinus rhythm, Q waves in III and aVF.    Outside records review:    - 12/21/19 hospitalization at [facility] in [place], pt had hypertension in the setting of dexamethasone and post-op from biopsy but resolved by time of discharge and was not discharged on medications.    - Creatinine 1.1 on 8/8 from [facility] system    I spoke with Dr. Daphene Calamity  from neurosurgery regarding blood pressure control..    Assessment and Recommendations    Terry [name] is a 54 y.o. male with OSA and multifocal glioblastoma s/p resection with neurosurgery on 9/2. Medicine is consulted for assistance with hypertension management.     # post-operative hypertension  No baseline hypertension but had a similar episode of difficult to control HTN after his brain biopsy at OSH in August. Blood pressure initially very high post operatively (SBP 150s-180s) now improved on captopril 50 mg q8h with PRN IV hydral and IV labetalol. Since then has continued to have spiking blood pressure mostly correlated with episodes of severe pain. No hypotension or presyncopal symptoms so likely OK to increase standing antihypertensives. Confirmed with pharmacy ok to increase dose of captopril to 62.5 mg (generally highest dose is 50 mg q8h) given stable creatinine.   Goal is SBP< 140 for the next 1-2 days while in hospital.      - continue to assertively control pain; I spoke with  nursing who plan to offer his pain medications whenever they are due as he doesn't ask for PRNs often   - increase captopril to 62.5 mg q8h starting tonight   - if not controlled by tomorrow mroning and no pre-syncopal sx could start hydralazine 10 mg q6h    - ok to continue amlodipine 10 mg but  won't have an effect for a few days   - continue PRN hydral 10 mg PRN SBP <150 1st line, labetalol 10 mg 2nd line   - when safe/feasible to do so, resume home CPAP     - at discharge would be cautious about antihypertensives given no baseline HTN    Code Status: FULL    Donna Christen, MD  PGY-3 Internal Medicine

## 2020-01-22 NOTE — Plan of Care (Signed)
Notified that patient had episode of  R facial twitching and weakness x 10 minutes with sharp L sided HA. Has mostly resolved by time of my evaluation.    D/w Dr. Franchot Erichsen:    Plan:  - 500mg  keppra iv now, increase standing dose to 1000mg  bid  - HCT now  - vEEG now to rule out seizure activity.

## 2020-01-22 NOTE — Consults (Addendum)
PHYSICAL THERAPY TREATMENT NOTE    PT relevant HPI   56M with multifocal glioma now s/p L crani for tumor resection 9/2.    Medical Update:       ASSESSMENT   Patient received resting in bed and agreeable to therapy. Patient denies having any pain or discomfort at this time. Patient currently on 1L O2 nasal cannula and placed on room air for activity. Patient SpO2 > 95% with activity with no evidence of increased work of breathing or shortness of breath with activity. Patient currently independent with bed mobility, transfers, gait x 150 feet, and modified independent for x 12 steps with x 1 railing via step-over-step pattern. Patient demonstrates good gait speed of 1.0 m/s with no unsteadiness or loss of balance.  Extra time spent educating patient upon importance of pacing and energy conservation techniques as well as progression of activity upon returning home. Patient verbalized understanding. No further skilled PT needed at this time as patient is near baseline. Will release to nursing for continued out of bed activities and gait as able. RN Lucas Mallow aware. Recommend home with OPPT    Focus next session:     [place] with outpatient PT     Patient Current Functional Status Sufficient for PT Discharge Recommendation  Yes   Discharge DME needs    Discharge transportation needs  Private car   Rehab Potential  Patient participates well in therapy and progressing towards goals     NURSING RECOMMENDATIONS  Inpatient Rehab Assistive Device Recommendation  None   Activity Recommendation  up ad lib     SUBJECTIVE  Subjective report:   agreeable to PT  Notable observations:   resting in bed, left in bed with call light inr each, all needs met, RN aware    SYSTEMS REVIEW  Integumentary  Integumentary deficits:  YesIntegumentary deficit detail:  head wrap c/d/i    Cardiopulmonary  Cardiopulmonary deficits:  Yes  Detail:  decreased activity  tolerance    Musculoskeletal  Musculoskeletal deficits:  Yes  Abnormal strength findings: BLE grossly 4+/5    Neuromuscular  Neuromuscular deficits:  Yes  Attention/neglect/double simultaneous stimulation: mild inattention of R side        Pain     Currently in pain: No          COMPREHENSIVE MOVEMENT ANALYSIS/TREATMENT  Precautions/WB status:  Yes  Precautions and weight bearing status comments: SBP < 140      Hemodynamic response:   Normal hemodynamic response:  Yes  Response:    Comments:   Pacing strategies  Patient performed and educated on : Pacing, Energy conservation strategies, Self monitoring of signs and symptoms of overexertion      Functional Mobility  Requires second person/additional health care providers: No    Bed mobility Current Initial    Supine < > Sit  Level of assist  Independent  Supervision (01/21/20 1031)   Device   Head of bed elevated (01/21/20 1031)   Intervention   Verbal cues (01/21/20 1031)   Supine<>sit comments:      Transfer Current Initial   Sit < > Stand  Level of assist  Independent  Supervision (01/21/20 1031)   Device  None  None (01/21/20 1031)   Intervention   Verbal cues (01/21/20 1031)   Transfer  Level of assist  From:  To:     Device     Intervention     Sit < > Stand comments:  Transfer comments:      Gait Current Initial   Level of assist  Independent  Supervision (01/21/20 1031)   Device  None  None (01/21/20 1031)   Intervention     Distance  150 feet  x5 ft chair to bed with close supervision (01/21/20 1031)   Gait comments:     Stairs Current Initial   Level of assist  Modified independent  Modified independent (01/22/20 0833)   Device  Rail  Rail (01/22/20 6256)   Intervention     Number of stairs  12  12 (01/22/20 3893)   Stairs comments:      Balance  Balance deficits noted:  No    Communication between other health care providers:  OT, RN  Communication comment:      Education assessment  Learner: Patient  Content: Plan of care, Discharge recommendations,  Activity recommendations  Response: Verbalizes understanding  Outcome measures   Physical Therapist Global Assessment of Mobility  Activity Achieved: Walking/Wheelchair [phone] with any level of assistance, with or without assist device  AMPAC 6-clicks basic mobility score:  24      PLAN  Plan of care status:  Discontinue inpatient physical therapy  PT Reason for discontinuation:  Safe for discharge recommendation, Safe with mobility, Inpatient PT needs met, Safe to return to prior living situation, Independent with mobility, PT goals met  Discontinuation comment:       Planned PT interventions:   Specific interventions:   Education interventions:   Comment:Terry [name] PT    01/22/2020

## 2020-01-22 NOTE — Consults (Signed)
OCCUPATIONAL THERAPY INITIAL EVALUATION      Diagnosis and brief medical history:  83M with multifocal glioma now s/p L crani for tumor resection 9/2.  Assessment:  Pt seen for OT eval this AM. Pt initially attempted at 1100 am for eval however after sitting EOB BP found to be 192/74- pt immediately returned to supine and RN encouraged OT to follow up in PM. Writer reattempted pt at 1300 and was able to complete bed level ADLs and UE assessment however again limited by seated EOB BP  155/81 and he was immediately returned to supine with RN aware. Pt demo mod I for bed mobility during both trial this AM. He was able to complete seated/supine LB/UB dressing tasks with setup and SUP. Pt did demo RUE coordination impairments most noticeably with finger opposition with increased time and effort observed to complete task. Additionally pt demo some cognitive impairments with flat affect and decreased insight into current medical situation/safety awareness observed. He will cont to benefit from skilled IP OT tx sessions to progress safety and independence in ADL routine and to address cog and [place] impairments. Recommend home with OP OT for d/c.    Recommendations    Recommended Discharge Disposition  Home, Outpt OT   Discharge DME Recommendations  No Occupational Therapy DME needs   Equipment Recommendations    DME Comment    Discharge Recommendations Comment  OP OT to address [facility] impairments/cog   Rehab Potential  Patient participates well in therapy and is progressing towards goal   Anticipated Assistance Available at Discharge  Family   Anticipated Type of Assistance Available at Discharge    Anticipated Time of Assistance Available at Discharge  Full time   Barriers to Discharge    Patient's current functional ability appropriate for D/C recommendations  Yes     Inpatient recommendations  OT Inpatient Recommendations  Out of bed for meals and ADLs, Out of bed to bathroom for toileting, Encourage participation in ADLs    Recommendation Comments  as vitals allow   Current Maximal Level of Assist Needed  Minimal assist     Prior Level of Function  Prior living environment comments: Pt lives in 2 level home with 3 STE and 12 stairs inside. Has rails for all stairs.  Available equipment or existing home modifications: None. Has tub shower.  Prior functional limitations: IADL  Social supports: Lives with wife and 21 year old son. Per pt report, wife available to assist 24/7 upon dc.  Means to access community: Family/friends  Occupation(s), roles and routines, exercise habits: GM at [employer]  Fall history: Yes  Fall history details: Pt reports fall last Thursday when he tripped over something. Denies any other falls prior to that.  Additional comments:    Brace/Precautions   Brace/Orthotic/Prosthetic: No  Precautions:   Yes   SBP < 140     Subjective Report: Pt received lying supine in bed, agreeable to OT eval "I feel much better than yesterday"    Patient/Family Goal: to go home    Objective Findings and Interventions    Areas of Occupation    Grooming and Light Hygiene  Supervision/safety, Set up   [place] Feeding  Set up   [place] Required    Toileting Clothing Management    Cueing Required    Comment    Upper Body Dressing  Supervision/safety,  Set up   Cueing Required  Verbal, Minimal   Comment    Lower Body Dressing  Supervision/safety, Set up   Cueing Required    Comment  able to complete figure 4 technique   Upper Body Bathing    Cueing Required    Comment    Lower Body Bathing    Cueing Required    Comment    ADL/IADL Comment        Functional Transfers    Bed Mobility From  Supine   Bed Mobility To  Sitting EOB   Level of Assist  Modified independent   Cueing Required    Transfer From    Transfer To    Level of Assist    Cueing Required    Technique    Functional Transfer Comment  pt unable to compelte functional tx during eval 2/2 BP being outside  therapeutic range         [facility] for ADLs       Client Factors  Deficits noted in the following area(s):: Problem solving;Information processing;Safety awareness;Working Chief of Staff functions comment: impaired insight into current cog/physical deficits  Musculoskeletal impairments noted in: RUE  Upper extremity function comment, impact on ADLs: RUE :impaired finger opposition and finger to nose (increased time, decreased accuracy overshooting target)         Psychosocial comment (coping, adherence): pt w/flat affect, cooperative, receptive to education    OT Education  Education  Learner: Patient  Content: Plan of care;Discharge recommendations;Activity recommendations  Response: Verbalizes understanding  Communication between other health care provider: PT;RN    OT Plan of Care  Planned OT Interventions: Activities of Daily Living retraining;Energy conservation education;Neuromuscular re-education;Assistive/Adaptive device training;Functional transfer training;Pain Management;Cognitive re-training;Patient/Family/Caregiver education  OT Prognosis for Goals: Good  OT Frequency: 3x/wk  OT Duration: 2;Weeks  Patient/Caregiver Agreeable to OT Plan of Care: Yes    OT Goals  Short Term Goal End Date: 02/05/20  Patient/Family Involved in Goal Setting: Yes         Additional OT Goal 1: Pt will demo improved [place] by to complete RUE finger opposition WFL  Additional OT Goal 2: Pt will demo increased safety awareness calling for RN when completing OOB transfers 3/3 trials       Michaelene Song  01/22/2020 1:29 PM

## 2020-01-22 NOTE — Progress Notes (Addendum)
[facility] Course  9/2 - to OR    Subjective  Resting    Vitals  Temp:  [36.7 C (98 F)-37.3 C (99.2 F)] 37 C (98.6 F)  Heart Rate:  [58-77] 58  *Resp:  [16-19] 19  BP: (127-186)/(54-102) 138/59  SpO2:  [91 %-99 %] 95 %    Input / Output  I/O last 2 completed shifts plus current shift:  In: 3636.25 [P.O.:2160; I.V.:1476.25]  Out: 3550 [Urine:3550]  Indwelling Urinary Catheter 01/20/20 16 (Active)   Number of days: 0       Physical Exam  AOx3  Speech fluent, no paraphasic errors  PERRL, EOMI  V1-V3 intact  Face =   Hearing intact  TML    No pronator drift  Trace R hemibody weakness (baseline)    Recent Labs     01/21/20  0546 01/20/20  1339 01/19/20  1356   WBC 13.1* 13.1* 13.8*   HGB 14.8 14.2 16.0   HCT 45.8 44.0 49.8   PLT 133* 160 185   NA 139 135 141   K 4.4 4.7 4.5   CL 107 105  --    CO2 26 20*  --    BUN 28* 31*  --    CREAT 1.20 1.23 1.22   GLU 100 175 114   CA 8.5 8.1*  --    MG 2.0  --   --    PT  --  13.7 13.3   INR  --  1.1 1.1         Problem-based Assessment & Plan  Terry [name] [id number] [phone]   75M with multifocal glioma now s/p L crani for tumor resection 9/2.   Exam: Trace R hemibody weakness (baseline), trace R PD, otherwise nonfocal.  - TCU  - SBP<140  - ALA low light precautions x 48 hours off today  - Labs WBC 13.1 Hgb 14.8 Plt 133  - keppra 500 bid, decadron 4 bid- taper off over 2 weeks  - postop MRI w/ DTI complete- GTR  - Neuro-oncology consult  - SCD, SQH  - PT/OT- rec home w/opt PT  Summary: POD#2 s/p L crani for tumor resection, exam at baseline. Postop MRI complete showing GTR  Plan: SQH today. Anticipate dc tomorrow with decadron 4 bid taper off over 2 weeks, keppra 500 bid, mobilize with PT/OT.        No new Assessment & Plan notes have been filed under this hospital service since the last note was generated.  Service: Neurosurgery         During this hospitalization the patient is being treated for:  Glioblastoma    Code Status:  FULL    Adriana Simas, MD  01/20/2020

## 2020-01-22 NOTE — Progress Notes (Signed)
Patient's wife updated on plan of care, all questions answered to her satisfaction.    Humberto Seals MD  01/22/20

## 2020-01-23 DIAGNOSIS — R9401 Abnormal electroencephalogram [EEG]: Secondary | ICD-10-CM

## 2020-01-23 DIAGNOSIS — I973 Postprocedural hypertension: Secondary | ICD-10-CM

## 2020-01-23 DIAGNOSIS — G9389 Other specified disorders of brain: Secondary | ICD-10-CM

## 2020-01-23 DIAGNOSIS — R569 Unspecified convulsions: Secondary | ICD-10-CM

## 2020-01-23 LAB — ECG 12-LEAD
Atrial Rate: 61 {beats}/min
Calculated P Axis: 44 degrees
Calculated R Axis: -9 degrees
Calculated T Axis: 22 degrees
P-R Interval: 142 ms
QRS Duration: 92 ms
QT Interval: 390 ms
QTcb: 392 ms
Ventricular Rate: 61 {beats}/min

## 2020-01-23 MED ORDER — LISINOPRIL 20 MG TABLET
20 | Freq: Every day | ORAL | Status: DC
Start: 2020-01-23 — End: 2020-01-25
  Administered 2020-01-24 – 2020-01-25 (×2): 20 mg via ORAL

## 2020-01-23 MED FILL — CAPTOPRIL 12.5 MG TABLET: 12.5 mg | ORAL | Qty: 1

## 2020-01-23 MED FILL — HEPARIN, PORCINE (PF) 5,000 UNIT/0.5 ML INJECTION SOLUTION: 5000 unit/0.5 mL | INTRAMUSCULAR | Qty: 0.5

## 2020-01-23 MED FILL — LANSOPRAZOLE 30 MG CAPSULE,DELAYED RELEASE: 30 mg | ORAL | Qty: 1

## 2020-01-23 MED FILL — ACETAMINOPHEN 500 MG TABLET: 500 mg | ORAL | Qty: 2

## 2020-01-23 MED FILL — LEVETIRACETAM 500 MG TABLET: 500 mg | ORAL | Qty: 2

## 2020-01-23 MED FILL — OXYCODONE 5 MG TABLET: 5 mg | ORAL | Qty: 2

## 2020-01-23 MED FILL — AMLODIPINE 10 MG TABLET: 10 mg | ORAL | Qty: 1

## 2020-01-23 MED FILL — SENNA LAX 8.6 MG TABLET: 8.6 mg | ORAL | Qty: 2

## 2020-01-23 MED FILL — HEALTHYLAX 17 GRAM ORAL POWDER PACKET: 17 gram | ORAL | Qty: 1

## 2020-01-23 MED FILL — DEXAMETHASONE 4 MG TABLET: 4 mg | ORAL | Qty: 1

## 2020-01-23 MED FILL — DEXAMETHASONE 2 MG TABLET: 2 mg | ORAL | Qty: 2

## 2020-01-23 NOTE — Consults (Signed)
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    Assessment and Treatment Plan    OT Progress summary:  Pt seen for OT tx session this AM. Pt BP within safe parameters for OOB activity during entire tx session today. He demo mod I for bed mobility and mod I for standing grooming tasks at sink. Pt able to complete UB dressing with SUP w/assist req 2/2 line management. He was able to don/doff socks mod I at EOB. Pt cont to demo [facility] impairments most notably with manipulating containers/objects. He expressed frustration with cont impairments and was educated on therex to address while in house as well as benefits of pursuing OP OT to continue to address impairments at discharge. Additionally pt observed to have word finding difficulties which he was able to independently identify. Pt encouraged to further pursue OP OT to address cont cog impairments. He verbalized good understanding of education provided. Pt able to complete functional transfers w/o AD w/mod I/SUP (2/2 decreased safety awareness associated with line management). Cont to recommend home with family support and follow up with outpatient OT to address RUE FMC/Cog impairments for discharge.  Progress with OT:  Good progress  Current maximal level of assist:  Supervision  Next session focus: FMC/cog stim     Recommendations    Recommended Discharge Disposition  Home, Outpt OT   Discharge DME Recommendations    Equipment Recommendations  Shower seat   DME Comment    Discharge Recommendations Comment  OP OT to address [facility] impairments/cog impairments   Rehab Potential  Patient participates well in therapy and is progressing towards goal   Anticipated Assistance Available at Discharge  Family   Anticipated Type of Assistance Available at Discharge  Assist as needed   Anticipated Time of Assistance Available at Discharge  Full time   Barriers to Discharge  Medical  issues   Patient's current functional ability appropriate for D/C recommendations  Yes     Inpatient Recommendations  OT Inpatient Recommendations  Out of bed for meals and ADLs, Out of bed to bathroom for toileting, Encourage participation in ADLs   Recommendation Comments  as vitals allow   Current Maximal Level of Assist Needed  Supervision     Brace/Precautions   Brace/Orthotic/Prosthetic: No  Precautions:   Yes   SBP < 140     Subjective Report: Pt received lying supine in bed, agreeable to OT tx session "I'm doing well today"    Patient/Family Goal: to go home    Objective Findings and Interventions    Areas of Occupation    Grooming and Light Hygiene  Modified independent, Set up   Cueing Required    Comment  to complete oral hygiene/face wash washing routine   Self Feeding    Cueing Required    Comment    Toileting    Cueing Required    Toileting Clothing Management    Cueing Required    Comment    Upper Body Dressing  Supervision/safety, Set up   Cueing Required  Minimal, Verbal   Comment  SUP 2/2 line management   Lower Body Dressing  Modified independent   Cueing Required    Comment  to don/doff socks   Upper Body Bathing    Cueing Required    Comment    Lower Body Bathing    Cueing Required    Comment  ADL/IADL Comment        Functional Transfers    Bed Mobility From  Supine   Bed Mobility To  Sitting EOB   Level of Assist  Modified independent   Cueing Required    Transfer From  Sit, Bed   Transfer To  Stand, Chair   Level of Assist  Modified independent   Cueing Required  Minimal, Verbal   Technique  Sit to stand, Stand to sit, Walk   Functional Transfer Comment  Mod I for bed > sink, bed > chair transfer, min cuing for pacing         [facility] for ADLs  6 Click Score: 22    Client Factors  Cognitive deficits noted in the following area(s):: Judgement/Safety  Cognition comment:  Deficits noted in the following area(s):: Information processing;Safety awareness  Executive functions  comment: increased processing time, decreased safety awareness but able to verbalize word finding impairments today  Musculoskeletal impairments noted in:  Upper extremity function comment, impact on ADLs: RUE difficulty manipulating toothpaste cap, reports feeling frustrated with impaired [facility]- educated on outpatient therapy and therex to improve coordination in house         Psychosocial comment (coping, adherence): pt w/flat affect, cooperative, receptive to education    OT Education  Education  Learner: Patient  Content: Plan of care;Discharge recommendations;Activity recommendations  Response: Verbalizes understanding  Communication between other health care provider: RN;PT  Communication comment: pt status    OT Plan of Care  Planned OT Interventions: Activities of Daily Living retraining;Energy conservation education;Neuromuscular re-education;Assistive/Adaptive device training;Functional transfer training;Pain Management;Cognitive re-training;Patient/Family/Caregiver education  OT Prognosis for Goals: Good  OT Frequency: 3x/wk  OT Duration: 2;Weeks  Patient/Caregiver Agreeable to OT Plan of Care: Yes    [place]  01/23/2020 10:59 AM

## 2020-01-23 NOTE — Plan of Care (Signed)
I called the patient's wife Wende Neighbors and updated her on our plan of care for Terry [name]. Anticipate dc tomorrow.    All questions answered to wife's satisfaction.    Humberto Seals MD  01/23/20

## 2020-01-23 NOTE — Procedures (Signed)
[name]  [address] Tel:  [phone]  [place] [address] Fax: [phone]  ______________________________________________    VIDEO-EEG Daily Report  Patient Name: Terry [name]  MRN: [id number]  DOB: 11-29-1965)  Room/Bed: W979/G921-19  Attending / Referring Physician: Raquel Sarna LEVEL HERVEY-JUMPER, MD    OBJECT OF RECORDING: 54 y.o. male with multifocal glioma s/p left craniotomy for resection who had an episode of headache, hypertension, and right facial droop concerning for seizure. He is undergoing video-EEG monitoring in order to evaluate his symptoms of episode(s) concerning for seizure.      STUDY START: 01/22/2020 at 15:58  STUDY STOP: 01/23/2020 at 17:50, study completed    CONDITIONS OF RECORDING:  Recordings were obtained using a standard international 10-20 electrode placement in a 19-channel standard recording supplemented with a single electrocardiogram chest electrode. The recordings were obtained using a reference electrode and reformatted into bipolar montages for review. Concurrent continuous video recording was utilized. Throughout the entire monitoring evaluation, a video sitter (EEG technologist) observed the patient in real-time. The Natus/Persyst spike and seizure detection computer program was used to screen the EEG. An epilepsy fellow and the attending neurologist reviewed the entire recording including detections. They also reviewed concurrent EEG and Video during episodes of interest.    FINDINGS - DAILY SUMMARIES AND DETAILED SEIZURE DATA:   DAY 1: 01/22/2020 from 15:58 to 23:59  Relevant medications: levetiracetam   Background: The background is continuous, composed of an admixture of largely alpha and beta frequencies. The expected anterior-posterior voltage and frequency organization was present, with intermixed faster frequencies anteriorly and a symmetric and well-formed posterior dominant alpha rhythm of 8 Hz that was reactive to eye opening  and better formed on the right. There is focal excess polymorphic delta slowing over the left hemisphere. Throughout the recording, there are higher amplitudes and increased prominence of fast frequencies over the left, consistent with breach artifact.   Interictal: No interictal epileptiform abnormalities.   Ictal/push-button events: There are no electrographic or clinical seizures.    DAY 2: 01/23/2020 from 00:00 to 17:50  Medication changes: levetiracetam   Background: Unchanged; in sleep, there are elements of normal sleep architecture   Interictal: Unchanged   Ictal/push-button events: None    IMPRESSION:   This continuous Video-EEG monitoring study was abnormal due to:    1. Mild focal left hemispheric slowing, a non-specific finding indicating focal cerebral dysfunction on this side  2. 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      Epilepsy/EMU attending(s) and dates of service:  Inda Merlin MD, PhD, 01/22/2020-01/23/2020

## 2020-01-23 NOTE — Consults (Addendum)
[facility] Hospitalist Brief Note:    Please see my attestation to Dr. Warren Lacy Pugh's initial consultation report for details of my encounter today. This note is not for billing.     Hypertension:  Not previously treated for this, but had elevated BP readings in some outpatient encounters including a reading of 183/133 in urgent care last month. Also has OSA, so a risk factor for secondary HTN. Currently on maximum dose amlodipine and a fairly high dose of captopril. BP normal in last 24 hours on this regimen.  Understand plan for discharge tomorrow. While some of the elevated readings here could be provoked by perioperative stressors, the prior elevated readings and surgeons' preference for tighter control makes continuation of treatment not unreasonable.  -- Continue Amlodipine 10 mg daily  -- Will switch Captopril TID to Lisinopril 20 mg daily starting tomorrow for dosing convenience  -- Have asked patient to check BP with his home machine around once daily and keep log    It's not clear that patient has an established primary care provider (the listed PCP Michaela Corner is actually a neurologist). I spoke with him and his wife: they will establish primary care.    Please prescribe adequate refills on new BP meds above to cover patient for 2-3 months, to provide time for them to get new outpatient provider.     Allene Pyo. Marchia Bond, MD  01/23/2020

## 2020-01-23 NOTE — Procedures (Signed)
[facility]  [address] Tel:  [phone]  [place] [address] Fax: [phone]  ______________________________________________    VIDEO-EEG Daily Report  Patient Name: Terry [name]  MRN: [id number]  DOB: [birth year])  Room/Bed: Q761/P509-32  Attending / Referring Physician: Raquel Sarna LEVEL HERVEY-JUMPER, MD    OBJECT OF RECORDING: 54 y.o. male with multifocal glioma s/p left craniotomy for resection who had an episode of headache, hypertension, and right facial droop concerning for seizure. He is undergoing video-EEG monitoring in order to evaluate his symptoms of episode(s) concerning for seizure.      STUDY START: 01/22/2020 at 15:58  STUDY STOP: 01/22/2020 at 23:59, study ongoing    CONDITIONS OF RECORDING:  Recordings were obtained using a standard international 10-20 electrode placement in a 19-channel standard recording supplemented with a single electrocardiogram chest electrode. The recordings were obtained using a reference electrode and reformatted into bipolar montages for review. Concurrent continuous video recording was utilized. Throughout the entire monitoring evaluation, a video sitter (EEG technologist) observed the patient in real-time. The Natus/Persyst spike and seizure detection computer program was used to screen the EEG. An epilepsy fellow and the attending neurologist reviewed the entire recording including detections. They also reviewed concurrent EEG and Video during episodes of interest.    FINDINGS - DAILY SUMMARIES AND DETAILED SEIZURE DATA:   DAY 1: 01/22/2020 from 15:58 to 23:59  Relevant medications: levetiracetam   Background: The background is continuous, composed of an admixture of largely alpha and beta frequencies. The expected anterior-posterior voltage and frequency organization was present, with intermixed faster frequencies anteriorly and a symmetric and well-formed posterior dominant alpha rhythm of 8 Hz that was reactive to eye opening  and better formed on the right. There is focal excess polymorphic delta slowing over the left hemisphere. Throughout the recording, there are higher amplitudes and increased prominence of fast frequencies over the left, consistent with breach artifact.   Interictal: No interictal epileptiform abnormalities.   Ictal/push-button events: There are no electrographic or clinical seizures.    IMPRESSION:   This continuous Video-EEG monitoring study was abnormal due to:    1. Mild focal left hemispheric slowing, a non-specific finding indicating focal cerebral dysfunction on this side  2. 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    Note: This signed report pertains to the time period between START and STOP above. The continuous-EEG recording itself is still ongoing.    Epilepsy/EMU attending(s) and dates of service:  Inda Merlin MD, PhD, 01/22/2020

## 2020-01-23 NOTE — Progress Notes (Addendum)
[facility] Course  9/2 - to OR    Subjective  Resting    Vitals  Temp:  [36.7 C (98.1 F)-37.3 C (99.2 F)] 37 C (98.6 F)  Heart Rate:  [56-80] 56  *Resp:  [16-20] 16  BP: (106-192)/(61-81) 123/76  SpO2:  [87 %-100 %] 100 %    Input / Output  I/O last 2 completed shifts plus current shift:  In: 1943 [P.O.:1840; I.V.:3; IV Piggyback:100]  Out: 3825 [Urine:3825]  Indwelling Urinary Catheter 01/20/20 16 (Active)   Number of days: 0       Physical Exam  AOx3  Speech fluent, no paraphasic errors  PERRL, EOMI  V1-V3 intact  Face =   Hearing intact  TML    No pronator drift  Trace R hemibody weakness (baseline)    Recent Labs     01/21/20  0546 01/20/20  1339   WBC 13.1* 13.1*   HGB 14.8 14.2   HCT 45.8 44.0   PLT 133* 160   NA 139 135   K 4.4 4.7   CL 107 105   CO2 26 20*   BUN 28* 31*   CREAT 1.20 1.23   GLU 100 175   CA 8.5 8.1*   MG 2.0  --    PT  --  13.7   INR  --  1.1         Problem-based Assessment & Plan  Terry [name] [id number] [phone]   37M with multifocal glioma now s/p L crani for tumor resection 9/2.   Exam: Trace R hemibody weakness (baseline), trace R PD, otherwise nonfocal.  - TCU  - SBP<140  - Labs WBC 13.1 Hgb 14.8 Plt 133  - keppra 1000 bid, decadron 4 bid- taper off over 2 weeks  - postop MRI w/ DTI complete- GTR  - HCT for episode of L HA, SBP > 180 - c/f acute hemorrhage into surgical resection cavity. Repeat HCT 6 hours later stable  - EEG for episode of R facial droop, twitching- negative for seizrues  - Neuro-oncology consult  - Hospitalist c/s for SBP control recs  - SCD, SQH  - PT/OT- rec home w/opt PT  Summary: POD#3 s/p L crani for tumor resection. Episode of severe HA and HTN yesterday w/R facial droop c/f seizure, HCT with acute focus of post-op hemorrhage. Interval HCT stable, no seizures on EEG.  Plan: Dc EEG, dc home today vs tomorrow with decadron 4 bid taper off over 2 weeks, keppra 750 bid x 90 days.      No new Assessment & Plan notes have been  filed under this hospital service since the last note was generated.  Service: Neurosurgery         During this hospitalization the patient is being treated for:  Glioblastoma    Code Status: FULL    Adriana Simas, MD  01/20/2020

## 2020-01-24 MED ORDER — AMLODIPINE 10 MG TABLET: 10 mg | ORAL | 0 refills | Status: AC

## 2020-01-24 MED ORDER — LISINOPRIL 20 MG TABLET
20 | ORAL_TABLET | Freq: Every day | ORAL | 0 refills | 90.00000 days | Status: AC
Start: 2020-01-24 — End: 2021-01-09

## 2020-01-24 MED ORDER — SIMETHICONE 80 MG CHEWABLE TABLET
80 mg | ORAL | Status: DC | PRN
  Administered 2020-01-24 – 2020-01-25 (×2): 120 mg via ORAL

## 2020-01-24 MED ORDER — CALCIUM CARBONATE 200 MG CALCIUM (500 MG) CHEWABLE TABLET
500 mg (200 mg elemental) | ORAL | Status: DC | PRN
  Administered 2020-01-24: 13:00:00 750 mg via ORAL

## 2020-01-24 MED ORDER — LEVETIRACETAM 1,000 MG TABLET
1000 | ORAL_TABLET | Freq: Two times a day (BID) | ORAL | 0 refills | Status: DC
Start: 2020-01-24 — End: 2021-01-09

## 2020-01-24 MED FILL — AMLODIPINE 10 MG TABLET: 10 mg | ORAL | Qty: 1

## 2020-01-24 MED FILL — CAPTOPRIL 12.5 MG TABLET: 12.5 mg | ORAL | Qty: 1

## 2020-01-24 MED FILL — LEVETIRACETAM 500 MG TABLET: 500 mg | ORAL | Qty: 2

## 2020-01-24 MED FILL — LANSOPRAZOLE 30 MG CAPSULE,DELAYED RELEASE: 30 mg | ORAL | Qty: 1

## 2020-01-24 MED FILL — OXYCODONE 5 MG TABLET: 5 mg | ORAL | Qty: 3

## 2020-01-24 MED FILL — CALCIUM CARBONATE 200 MG CALCIUM (500 MG) CHEWABLE TABLET: 500 mg (200 mg elemental) | ORAL | Qty: 1

## 2020-01-24 MED FILL — DILAUDID (PF) 0.5 MG/0.5 ML INJECTION SYRINGE: 0.5 mg/ mL | INTRAMUSCULAR | Qty: 0.5

## 2020-01-24 MED FILL — DEXAMETHASONE 2 MG TABLET: 2 mg | ORAL | Qty: 2

## 2020-01-24 MED FILL — SENNA LAX 8.6 MG TABLET: 8.6 mg | ORAL | Qty: 2

## 2020-01-24 MED FILL — LISINOPRIL 20 MG TABLET: 20 mg | ORAL | Qty: 1

## 2020-01-24 MED FILL — ACETAMINOPHEN 500 MG TABLET: 500 mg | ORAL | Qty: 2

## 2020-01-24 MED FILL — MI-ACID GAS RELIEF (SIMETHICONE) 80 MG CHEWABLE TABLET: 80 mg | ORAL | Qty: 2

## 2020-01-24 MED FILL — HEALTHYLAX 17 GRAM ORAL POWDER PACKET: 17 gram | ORAL | Qty: 1

## 2020-01-24 MED FILL — ONDANSETRON HCL (PF) 4 MG/2 ML INJECTION SOLUTION: 4 mg/2 mL | INTRAMUSCULAR | Qty: 2

## 2020-01-24 MED FILL — HEPARIN, PORCINE (PF) 5,000 UNIT/0.5 ML INJECTION SOLUTION: 5000 unit/0.5 mL | INTRAMUSCULAR | Qty: 0.5

## 2020-01-24 MED FILL — HYDRALAZINE 20 MG/ML INJECTION SOLUTION: 20 mg/mL | INTRAMUSCULAR | Qty: 1

## 2020-01-24 NOTE — Progress Notes (Signed)
[facility] Course  9/2 - to OR    Subjective  Resting    Vitals  Temp:  [36.5 C (97.7 F)-37.5 C (99.5 F)] 36.5 C (97.7 F)  Heart Rate:  [62-96] 67  *Resp:  [16-20] 18  BP: (117-146)/(66-84) 139/76  SpO2:  [91 %-97 %] 97 %    Input / Output  I/O last 2 completed shifts plus current shift:  In: 1113 [P.O.:1110; I.V.:3]  Out: 3150 [Urine:3150]  Indwelling Urinary Catheter 01/20/20 16 (Active)   Number of days: 0       Physical Exam  AOx3  Speech fluent, no paraphasic errors  PERRL, EOMI  V1-V3 intact  Face =   Hearing intact  TML    No pronator drift  Trace R hemibody weakness (baseline)    No results for input(s): WBC, HGB, HCT, PLT, NA, K, CL, CO2, BUN, CREAT, GLU, CA, MG, PO4, PT, INR, PTT, AST, ALT, ALKP, ALB, TBILI, CYCL, TAC, SIRO in the last 72 hours.      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   Exam: Trace R hemibody weakness (baseline), trace R PD, otherwise nonfocal.  - TCU  - SBP<140  - No new labs  - keppra 1000 bid, decadron 4 bid- taper off over 2 weeks  - postop MRI w/ DTI complete- GTR  - Neuro-oncology consult- will notify of dc today  - Hospitalist c/s for SBP control recs- amlodipine/lisinopril  - SCD, SQH  - PT/OT- rec home w/opt PT  Summary: POD#3 s/p L crani for tumor resection. Exam stable, no reported seizures or HTN   Plan: Dc home with decadron 4 bid taper off over 2 weeks, keppra 1000 bid x 90 days.    Information:  - HCT for episode of L HA, SBP > 180 - c/f acute hemorrhage into surgical resection cavity. Repeat HCT 6 hours later stable  - EEG for episode of R facial droop, twitching- negative for seizrues, dc'd    No new Assessment & Plan notes have been filed under this hospital service since the last note was generated.  Service: Neurosurgery         During this hospitalization the patient is being treated for:  Glioblastoma    Code Status:  FULL    Adriana Simas, MD  01/20/2020

## 2020-01-24 NOTE — Plan of Care (Signed)
Problem: Discharge Planning - Adult  Goal: Knowledge of and participation in plan of care  01/24/2020 0244 by Toula Moos, RN  Outcome: Progress within 12 hours  01/24/2020 0242 by Toula Moos, RN  Outcome: Progress within 12 hours

## 2020-01-25 LAB — COVID-19 RNA, RT-PCR/NUCLEIC ACID AMPLIFICATION: COVID-19 RNA, RT-PCR/Nucleic Acid Amplification: NOT DETECTED

## 2020-01-25 MED ORDER — ONDANSETRON HCL 4 MG TABLET: 4 mg | ORAL | 0 refills | Status: AC | PRN

## 2020-01-25 MED ORDER — NALOXONE 4 MG/ACTUATION NASAL SPRAY: 4 mg/actuation | NASAL | 0 refills | Status: AC | PRN

## 2020-01-25 MED ORDER — OXYCODONE 5 MG TABLET: 5 mg | ORAL | 0 refills | Status: AC | PRN

## 2020-01-25 MED FILL — HEALTHYLAX 17 GRAM ORAL POWDER PACKET: 17 gram | ORAL | Qty: 1

## 2020-01-25 MED FILL — ACETAMINOPHEN 500 MG TABLET: 500 mg | ORAL | Qty: 2

## 2020-01-25 MED FILL — OXYCODONE 5 MG TABLET: 5 mg | ORAL | Qty: 2

## 2020-01-25 MED FILL — LEVETIRACETAM 500 MG TABLET: 500 mg | ORAL | Qty: 2

## 2020-01-25 MED FILL — LISINOPRIL 20 MG TABLET: 20 mg | ORAL | Qty: 1

## 2020-01-25 MED FILL — HEPARIN, PORCINE (PF) 5,000 UNIT/0.5 ML INJECTION SOLUTION: 5000 unit/0.5 mL | INTRAMUSCULAR | Qty: 0.5

## 2020-01-25 MED FILL — ONDANSETRON HCL (PF) 4 MG/2 ML INJECTION SOLUTION: 4 mg/2 mL | INTRAMUSCULAR | Qty: 2

## 2020-01-25 MED FILL — DEXAMETHASONE 2 MG TABLET: 2 mg | ORAL | Qty: 2

## 2020-01-25 MED FILL — DILAUDID (PF) 0.5 MG/0.5 ML INJECTION SYRINGE: 0.5 mg/ mL | INTRAMUSCULAR | Qty: 0.5

## 2020-01-25 MED FILL — TRAZODONE 50 MG TABLET: 50 mg | ORAL | Qty: 2

## 2020-01-25 MED FILL — SENNA LAX 8.6 MG TABLET: 8.6 mg | ORAL | Qty: 2

## 2020-01-25 MED FILL — LANSOPRAZOLE 30 MG CAPSULE,DELAYED RELEASE: 30 mg | ORAL | Qty: 1

## 2020-01-25 MED FILL — OXYCODONE 5 MG TABLET: 5 mg | ORAL | Qty: 3

## 2020-01-25 MED FILL — MI-ACID GAS RELIEF (SIMETHICONE) 80 MG CHEWABLE TABLET: 80 mg | ORAL | Qty: 2

## 2020-01-25 MED FILL — AMLODIPINE 10 MG TABLET: 10 mg | ORAL | Qty: 1

## 2020-01-25 NOTE — Plan of Care (Signed)
Problem: Discharge Planning - Adult  Goal: Knowledge of and participation in plan of care  Outcome: Progress within 12 hours     Problem: Seizure, at Risk or Actual - Adult  Goal: Absence of injury or aspiration during seizure  Outcome: Progress within 12 hours     Problem: Deep Venous Thrombosis, Risk of - Neurological Condition - Adult  Goal: Absence of deep venous thrombosis  Outcome: Progress within 12 hours

## 2020-01-25 NOTE — Interdisciplinary (Signed)
Discharge instructions reviewed with patient and spouse: all questions answered. Home Medications picked up from pharmacy:verified with patient, form signed and placed in the chart.     Oxycodone prescription given to patient. All belongings verified and sent home with patient: belongings form signed by patient/RN and placed in the chart.     Heart monitor/CPO monitor removed, cleaned and docked at the nurses station. Peripheral IV'S removed; clean, dry, intact dressing present.   Transport requested to assist patient downstairs safely.

## 2020-01-25 NOTE — Unmapped (Signed)
Craniotomy: What to Expect at Home  Your Recovery  A craniotomy is surgery to open your skull to fix a problem in your brain. It can be done for many reasons. For example, you may need a this surgery if your brain or blood vessels are damaged or if you have a tumor or an infection in your brain.  You will probably feel very tired for several weeks after surgery. You may also have headaches or problems concentrating. It can take 4 to 8 weeks to recover from surgery.  Your cuts (incisions) may be sore for about 5 days after surgery. Your scalp may swell with fluid. You may also have numbness and shooting pains near your wound. And you may have swelling and bruising around your eyes. As your wound starts to heal, it may begin to itch. Medicines and ice packs can help with headaches, pain, swelling, and itching.  The stitches that hold your incisions together may go away on their own or will be removed in 7 to 10 days. This depends on the type of stitches the doctor uses.  Some kinds of plates stay attached to hold the skull flap to your head. If your head was shaved, you may want to wear hats or scarves on your head until your hair grows back. Or it may not bother you.  You may need to go to a short-term rehabilitation center after you leave the hospital. This can help you learn to do the tasks you need to do after you go home.  This care sheet gives you a general idea about how long it will take for you to recover. But each person recovers at a different pace. Follow the steps below to get better as quickly as possible.  How can you care for yourself at home?  Activity    Rest when you feel tired. It is normal to want to sleep during the day. It is a good idea to plan to take a nap every day. Getting enough sleep will help you recover.     Try not to lie flat when you rest or sleep. You can use a wedge pillow, or you can put a rolled towel or foam padding under your pillow. You can also raise the head of your bed  by putting bricks or wooden blocks under the bed legs.     After lying down, bring your head up slowly. This can prevent headaches or dizziness.     You can wash your hair 2 to 3 days after your surgery. But do not soak your head or swim for 2 to 3 weeks.     Do not dye or color your hair for 4 weeks after your surgery.     Try to walk each day. Start by walking a little more than you did the day before. Bit by bit, increase the amount you walk. Walking boosts blood flow and helps prevent pneumonia and constipation.     Avoid heavy lifting until your doctor says it is okay.     Do not drive for 2 to 3 weeks or until your doctor says it is okay.     Ask your doctor if it is safe for you to travel by plane.   Diet    You can eat your normal diet. If your stomach is upset, try bland, low-fat foods like plain rice, broiled chicken, toast, and yogurt.     Follow your doctor's orders about how much fluid you should drink  after surgery.     Do not drink alcohol until your doctor says it is okay.     You may notice that your bowel movements are not regular right after your surgery. This is common. Try to avoid constipation and straining with bowel movements. You may want to take a fiber supplement every day. If you have not had a bowel movement after a couple of days, ask your doctor about taking a mild laxative.   Medicines    Your doctor will tell you if and when you can restart your medicines. Your doctor will also give you instructions about taking any new medicines.     If you take aspirin or some other blood thinner, ask your doctor if and when to start taking it again. Make sure that you understand exactly what your doctor wants you to do.     Be safe with medicines. Take pain medicines exactly as directed.  ? If the doctor gave you a prescription medicine for pain, take it as prescribed.  ? If you are not taking a prescription pain medicine, ask your doctor if you can take an  over-the-counter medicine.     If you think your pain medicine is making you sick to your stomach:  ? Take your medicine after meals (unless your doctor has told you not to).  ? Ask your doctor for a different pain medicine.     If your doctor prescribed antibiotics, take them as directed. Do not stop taking them just because you feel better. You need to take the full course of antibiotics.     If you get medicines to prevent seizures, take them exactly as directed.   Incision care    If you have strips of tape on the incisions, leave the tape on for a week or until it falls off.     Keep the area clean and dry. Change the bandage every 2 days, or if it gets wet or soiled.     After your doctor says it is okay to shower or bathe, gently wash the surgery area with warm, soapy water and pat it dry.   Exercise    Avoid risky activities, such as climbing a ladder, for 3 months after surgery.     Avoid strenuous activities, such as bicycle riding, jogging, weight lifting, or aerobic exercise, for 3 months or until your doctor says it is okay.     Do not play any rough or contact sports for 3 months or until your doctor says it is okay.   Ice    For the first 1 or 2 days, you can use ice to reduce pain, swelling, and itching. Put ice or a cold pack on your head for 10 to 20 minutes at a time. Put a thin cloth between the ice and your skin.   Follow-up care is a key part of your treatment and safety. Be sure to make and go to all appointments, and call your doctor if you are having problems. It's also a good idea to know your test results and keep a list of the medicines you take.  When should you call for help?   Call 911 anytime you think you may need emergency care. For example, call if:    You passed out (lost consciousness).     You have severe trouble breathing.     You have sudden chest pain and shortness of breath, or you cough up blood.     It  is hard to think, move, speak, or see.      Your body is jerking or shaking.   Call your doctor now or seek immediate medical care if:    You have trouble thinking clearly.     You have a fever with a stiff neck or a severe headache.     Your incision comes open.     You have signs of infection, such as:  ? Increased pain, swelling, warmth, or redness.  ? Red streaks leading from the incision.  ? Pus draining from the incision.  ? Swollen lymph nodes in your neck, armpits, or groin.  ? A fever.     You have any sudden vision changes.     You have new or worse headaches.     You fall and hit your head.     You are sleeping more than you are awake.     You have pain that does not get better after you take pain medicine.     You have a fever over 100F.     You have a headache and you throw up.   Watch closely for changes in your health, and be sure to contact your doctor if you have any problems.  Where can you learn more?  Go to http://blackburn.com/  Enter I325 in the search box to learn more about "Craniotomy: What to Expect at Home."  Current as of: November 4, 2020Content Version: 12.9   2006-2021 [employer].   Care instructions adapted under license by your healthcare professional. If you have questions about a medical condition or this instruction, always ask your healthcare professional. [place] any warranty or liability for your use of this information.      Brain: Anatomy Sketch     Current as of: January 19, 2021Content Version: 12.9   2006-2021 [employer].   Care instructions adapted under license by your healthcare professional. If you have questions about a medical condition or this instruction, always ask your healthcare professional. [place] any warranty or liability for your use of this information.    Malignant Brain Tumor (Primary): Care Instructions  Overview  A primary malignant brain tumor  is cancer that begins in the brain. Cancer occurs when abnormal cells grow out of control. These tumors usually grow quickly. They can spread throughout the brain and sometimes to the spinal cord. As the tumors grow, they can affect important brain functions. Brain cancer can be deadly.  There are many types of malignant brain tumors. Treatment depends on tumor type and location in the brain. Treatment includes radiation, surgery, medicines (chemotherapy), or a combination of these.  Follow-up care is a key part of your treatment and safety. Be sure to make and go to all appointments, and call your doctor if you are having problems. It's also a good idea to know your test results and keep a list of the medicines you take.  How can you care for yourself at home?   Take your medicines exactly as prescribed. Call your doctor if you think you are having a problem with your medicine. You may get medicine for nausea and vomiting if you have these side effects.   Eat healthy food. If you do not feel like eating, try to eat food that has protein and extra calories to keep up your strength and prevent weight loss. Drink liquid meal replacements for extra calories and protein. Try to eat your main meal early.   Get  some physical activity every day, but do not get too tired. Keep doing the hobbies you enjoy, as your energy allows.   Take steps to control your stress and workload. Learn relaxation techniques.  ? Share your feelings. Stress and tension affect our emotions. By expressing your feelings to others, you may be able to understand and cope with them.  ? Consider joining a support group. Talking about a problem with your spouse, a good friend, or other people with similar problems is a good way to reduce tension and stress.  ? Express yourself through art. Try writing, dance, art, or crafts to relieve tension. Some dance, writing, or art groups may be available just for people who have cancer.  ? Be kind to your  body and mind. Getting enough sleep, eating a healthy diet, and taking time to do things you enjoy can contribute to an overall feeling of balance in your life and can help reduce stress.  ? Get help if you need it. Discuss your concerns with your doctor or counselor.   If you are vomiting or have diarrhea:  ? Drink plenty of fluids to prevent dehydration. Choose water and other caffeine-free clear liquids. If you have kidney, heart, or liver disease and have to limit fluids, talk with your doctor before you increase the amount of fluids you drink.  ? When you are able to eat, try clear soups, mild foods, and liquids until all symptoms are gone for 12 to 48 hours. Other good choices include dry toast, crackers, cooked cereal, and gelatin dessert, such as Jell-O.   If you have not already done so, prepare a list of advance directives. Advance directives are instructions to your doctor and family members about what kind of care you want if you become unable to speak or express yourself.  When should you call for help?   Call 911 anytime you think you may need emergency care. For example, call if:    You have a seizure (convulsions).     You passed out (lost consciousness).   Call your doctor now or seek immediate medical care if:    You have new or worse nausea or vomiting.     You have new or worse headaches.     You have new symptoms of brain problems, such as weakness, numbness, or speech or vision changes.   Watch closely for changes in your health, and be sure to contact your doctor if:    You do not get better as expected.   Where can you learn more?  Go to http://blackburn.com/  Enter F468 in the search box to learn more about "Malignant Brain Tumor (Primary): Care Instructions."  Current as of: December 17, 2020Content Version: 12.9   2006-2021 [employer].   Care instructions adapted under license by your healthcare professional. If you have questions  about a medical condition or this instruction, always ask your healthcare professional. [place] any warranty or liability for your use of this information.

## 2020-01-25 NOTE — Progress Notes (Signed)
[facility]  9/2 - to OR    Subjective  Resting    Vitals  Temp:  [36.6 C (97.9 F)-37.2 C (99 F)] 36.6 C (97.9 F)  Heart Rate:  [51-71] 60  *Resp:  [16-20] 20  BP: (104-148)/(60-80) 131/80  SpO2:  [95 %-100 %] 97 %    Input / Output  I/O last 2 completed shifts plus current shift:  In: 1820 [P.O.:1817; I.V.:3]  Out: 1845 [Urine:1845]  Indwelling Urinary Catheter 01/20/20 16 (Active)   Number of days: 0       Physical Exam  AOx3  Speech fluent, no paraphasic errors  PERRL, EOMI  V1-V3 intact  Face =   Hearing intact  TML    No pronator drift  Trace R hemibody weakness (baseline)    No results for input(s): WBC, HGB, HCT, PLT, NA, K, CL, CO2, BUN, CREAT, GLU, CA, MG, PO4, PT, INR, PTT, AST, ALT, ALKP, ALB, TBILI, CYCL, TAC, SIRO in the last 72 hours.      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   Exam: Trace R hemibody weakness (baseline), trace R PD, otherwise nonfocal.  - TCU  - SBP<140  - No new labs  - keppra 1000 bid, decadron 4 bid- taper off over 2 weeks  - postop MRI w/ DTI complete- GTR  - Neuro-oncology consult- will notify of dc today  - Hospitalist c/s for SBP control recs- amlodipine/lisinopril  - SCD, SQH  - PT/OT- rec home w/opt PT  Summary: POD#4 s/p L crani for tumor resection. Exam stable, remained in-house overnight for pain control  Plan: Dc home with decadron 4 bid taper off over 2 weeks, keppra 1000 bid x 90 days.    Information:  - HCT for episode of L HA, SBP > 180 - c/f acute hemorrhage into surgical resection cavity. Repeat HCT 6 hours later stable  - EEG for episode of R facial droop, twitching- negative for seizrues, dc'd    No new Assessment & Plan notes have been filed under this hospital service since the last note was generated.  Service: Neurosurgery         During this hospitalization the patient is being treated for:  Glioblastoma    Code  Status: FULL    Adriana Simas, MD  01/20/2020

## 2020-01-25 NOTE — Plan of Care (Signed)
Attempted to reach wife x2 to give requested update. Left voicemail stating patient stable to dc on decadron taper as written.

## 2020-01-25 NOTE — Discharge Summary (Addendum)
[place]     Patient Name: Terry [name]  Patient MRN: [id number]  Date of Birth: 31-Jul-1965    Facility: [facility]  Attending Physician: Dr. Norva Karvonen, MD     Date of Admission: 01/20/2020  Date of Discharge: 01/25/2020    Admission Diagnosis: multifocal glioblastoma  Discharge Diagnosis: Same    Discharge Disposition: Home    History (with Chief Complaint)  This is a 54 year old right handed male who presented with hemisensory loss and aphasia found to have a left frontal and parietal enhancing brain mass concerning for high grade glioma.       Brief Hospital Course by Problem    Patient was taken to the operating room for stated procedure:   1. Left frontal-parietal awake craniotomy for tumor resection  2. Use of intraoperative microscope for microdissection  3. Use of Brainlab Neuro-navigation  4. Cortical and Subcortical Language Mapping   5.   Cortical and Subcortical Motor Mapping      For details of the operation please see Dr. Raquel Sarna Hervey-Jumper's detailed operative report. Postoperatively the patient was admitted to the ICU for neuro checks. Subsequently, the patient was transferred to the floor. The postoperative course was uncomplicated, and all surgical drains were removed without issue. Postop MRI showed GTR. On the day of discharge the patient was tolerating a REGULAR diet. Pain was controlled, and the patient was deemed stable for discharge to HOME by PT/OT.     During this admission this patient was treated for:  "Cytotoxic edema" is documented in the 9/3 MR Brain.        --Cerebral edema as seen on MR Brain requiring decadron      --Presentation/Clinical Findings/Risk: 9/7 PN: "W/ multifocal glioma now s/p L crani for tumor resection 9/2."     --Treatment/Evaluation: MR Brain, decadron     Complication: none    Consulting services and final recommendations:   Hospitalist  Hypertension:  Not previously treated for this, but had elevated BP readings in  some outpatient encounters including a reading of 183/133 in urgent care last month. Also has OSA, so a risk factor for secondary HTN. Currently on maximum dose amlodipine and a fairly high dose of captopril. BP normal in last 24 hours on this regimen. Understand plan for discharge tomorrow. While some of the elevated readings here could be provoked by perioperative stressors, the prior elevated readings and surgeons' preference for tighter control makes continuation of treatment not unreasonable.  -- Continue Amlodipine 10 mg daily  -- Will switch Captopril TID to Lisinopril 20 mg daily starting tomorrow for dosing convenience  -- Have asked patient to check BP with his home machine around once daily and keep log    It's not clear that patient has an established primary care provider (the listed PCP Michaela Corner is actually a neurologist). I spoke with him and his wife: they will establish primary care.    Please prescribe adequate refills on new BP meds above to cover patient for 2-3 months, to provide time for them to get new outpatient provider.       Physical Exam at Discharge    Trace R hemibody weakness (baseline), trace R PD, otherwise nonfocal.    BP 130/76 (BP Location: Right upper arm, Patient Position: Lying)   Pulse 70   Temp 36.5 C (97.7 F) (Oral)   Resp 19   Ht 180.3 cm (5\' 11" )   Wt 93.1 kg (205 lb 4 oz)  SpO2 100%   BMI 28.63 kg/m     No intake or output data in the 24 hours ending 01/26/20 0850    Relevant Labs, Radiology, and Other Studies  No results for input(s): WBC, HCT, PLT, INR, PTT in the last 72 hours.  No results for input(s): NA, K, CL, CO2, BUN, CREAT, GLU, CA, MG, PO4 in the last 72 hours.  No results for input(s): TBILI, AST, ALT in the last 72 hours.    Invalid input(s): [facility]  Microbiology Results (last 72 hours)     ** No results found for the last 72 hours. **        Radiology Results (last 72 hours)     Procedure Component Value Units Date/Time    CT Brain without  Contrast [[id number]] Collected: 01/23/20 0301    Order Status: Completed Updated: 01/23/20 1204    Narrative:      CT BRAIN WITHOUT CONTRAST:  01/23/2020 2:07 AM    INDICATION (as provided by referring clinician): repeat HCT eval IPH    ADDITIONAL HISTORY: None    COMPARISON: MRI brain 01/21/2020     TECHNIQUE: Helical CT imaging of the brain without intravenous contrast. Coronal and sagittal reformatted images were obtained.    MEDICATIONS:  None    RADIATION DOSE INDICATORS:  Exposure Events: 3 , CTDIvol Max: 55.1 mGy, DLP: 1085.4 mGy.cm    FINDINGS:     Compared to 01/22/2020, no interval change in extra-axial and parenchymal hemorrhage in the left frontal and parietal lobes with evolving postsurgical changes. Small subgaleal collection overlying the craniectomy has slightly increased in size now measuring 1.7 cm (previously 1.5 cm). No new intracranial hemorrhage, hydrocephalus or herniation.      Impression:        1.  Unchanged blood products in the left cerebral hemisphere.  2.  Mild interval increase in size of a postoperative soft tissue collection overlying the craniotomy.    Report dictated by: Delia Heady, DO, signed by: Renee Harder, MD, PhD  Department of Radiology and Biomedical Imaging    CT Brain without Contrast [[id number]] Collected: 01/22/20 1907    Order Status: Completed Updated: 01/23/20 1203    Narrative:      CT BRAIN WITHOUT CONTRAST:  01/22/2020 6:44 PM    INDICATION (as provided by referring clinician): seizure, r/o hemorrhage    ADDITIONAL HISTORY: None    COMPARISON: MRI brain 01/21/2020     TECHNIQUE: Helical CT imaging of the brain without intravenous contrast. Coronal and sagittal reformatted images were obtained.    MEDICATIONS:  None    RADIATION DOSE INDICATORS:  Exposure Events: 2 , CTDIvol Min: 55.6 mGy, CTDIvol Max: 55.6 mGy, DLP: 1221.5 mGy.cm    FINDINGS:     Evolving postsurgical changes of left frontoparietal tumor resection with scattered extra-axial and parenchymal hemorrhage  in the left cerebral hemisphere. No new intracranial hemorrhage compared with the brain MRI of 01/21/2020. No hydrocephalus or herniation.      Impression:        Evolving postsurgical changes in the left frontoparietal region with scattered extra-axial and parenchymal hemorrhage. No new intracranial hemorrhage or hydrocephalus.    Report dictated by: Delia Heady, DO, signed by: Renee Harder, MD, PhD  Department of Radiology and Biomedical Imaging        Specimens (From admission, onward)             Surgical Pathology  RELEASE UPON ORDERING     Additional Information Start Time Order  ID Status   Source: Other(specify)     01/20/20 1139 [id number] Sent      Question:  Special Specimen Processing Needs  Answer:  None                       Procedures Performed and Complications  09/20/6501: Procedure(s):  awake left fronto-parietal CRANIOTOMY FOR SUPRATENTORIAL TUMOR RESECTION  NEURO BRAINLAB NAVIGATION (CRANIOTOMY)    DISCHARGE INSTRUCTIONS    Discharge Diet  Regular Diet    Functional Assessment at Discharge/Activity Goals  No functional activity limits.    Allergies and Medications at Discharge    Allergies: Patient has no known allergies.       Medication List      START taking these medications    acetaminophen 500 mg tablet  Commonly known as: TYLENOL  Take 2 tablets (1,000 mg total) by mouth every 8 (eight) hours as needed (mild pain)  Notes to patient: FOR: mild pain or fever. Do not take more than 4000 mg of acetaminophen per day in all medicines containing this ingredient. Avoid alcohol     amLODIPine 10 mg tablet  Commonly known as: NORVASC  Take 1 tablet (10 mg total) by mouth daily     lisinopriL 20 mg tablet  Commonly known as: PRINIVIL,ZESTRIL  Take 1 tablet (20 mg total) by mouth daily     naloxone 4 mg/actuation Spraynaero  1 spray by Nasal route once as needed (suspected overdose) for up to 1 dose Call 911. Repeat if needed     ondansetron 4 mg tablet  Commonly known as: ZOFRAN  Take 1 tablet (4 mg  total) by mouth every 6 (six) hours as needed for Nausea for up to 21 days     polyethylene glycol 17 gram packet  Commonly known as: MIRALAX  Take 17 g by mouth daily as needed (constipation)  Notes to patient: FOR: Constipation. Dissolve powder in 8 ounces of water, juice, cola, or tea. Do not take if you are experiencing loose stools     senna 8.6 mg tablet  Commonly known as: SENOKOT  Take 2 tablets (17.2 mg total) by mouth nightly as needed for Constipation  Notes to patient: FOR: constipation. Hold if you have loose stools. Drink plenty of fluids.        CHANGE how you take these medications    dexAMETHasone 1 mg tablet  Commonly known as: DECADRON  Take with food by mouth as directed by taper calendar. Last day on 02/05/20  What changed:    medication strength   how much to take   how to take this   when to take this   additional instructions  Notes to patient: Steroid for swelling. Take in evenly spaced throughout the day. CALL YOUR DOCTOR for Symptoms of: Headaches that are not controlled with pain medicine; Sedation, confusion, weakness; Vision or speech changes     levETIRAcetam 1,000 mg tablet  Commonly known as: KEPPRA  Take 1 tablet (1,000 mg total) by mouth 2 (two) times daily  What changed:    medication strength   how much to take     omeprazole 20 mg capsule  Commonly known as: PRILOSEC  What changed: additional instructions  Notes to patient: FOR: Prevention of ulcers. Best taken on an empty stomach (e.g., at least one hour before eating). May interfere with the absorption of some vitamins, calcium and some antibiotics     oxyCODONE 5 mg tablet  Commonly known  as: ROXICODONE  Take 1-2 tablets (5-10 mg total) by mouth every 6 (six) hours as needed for Pain (moderate to severe pain)  What changed:    how much to take   when to take this   reasons to take this        CONTINUE taking these medications    docusate sodium 100 mg capsule  Commonly known as: COLACE     LORazepam 0.5 mg tablet   Commonly known as: ATIVAN     traZODone 50 mg tablet  Commonly known as: DESYREL        Medication Instructions:     Dexamethasone (Decadron) 1 mg tablet        Taper as directed per attached calendar.     Please be sure to take the correct number tablets that corresponds to your dosage on the calendar.   For example: Each tablet contains dexamethasone 1 mg, so if the dose is ? 4 mg, you need to take 4 tablets. If the dose is ? 2 mg, you need to take 2 tablets, and so forth.      *Take in evenly spaced doses throughout the day with food to prevent stomach upset  **Do not take near bedtime may keep you awake    CALL YOUR DOCTOR [phone]) for symptoms of increased pressure inside your head:  1) Headaches that are not controlled with pain medication  2) Increasing sedation  3) Increasing confusion  4) Increasing weakness  5) Blurred vision  6) Slurred speech       Patient Name: Terry [name], Terry [name]    Discharge Date: January-20     September        Dexamethasone Taper Schedule  ** 1 mg Dexamethasone Tablets ** Take with Food **   Sunday Monday Tuesday Wednesday Thursday Friday Saturday    August 29 August 30 August 31 September 1 September 2 September 3 September 4       Surgery ? 4 mg  ? 4 mg ? 4 mg  ? 4 mg   September 5 September 6 September 7 September 8 September 9 September 10 September 11   ? 3 mg  ? 3 mg ? 3 mg  ? 3 mg ? 3 mg  ? 3 mg ? 2 mg  ? 2 mg ? 2 mg  ? 2 mg ? 2 mg  ? 2 mg ? 1 mg  ? 1 mg   September 12 September 13 September 14 September 15 September 16 September 17 September 18   ? 1 mg  ? 1 mg ? 1 mg  ? 1 mg ? 1 mg  *Take in the morning ? 1 mg  *Take in the morning ? 1 mg  *Take in the morning ? 0.5 mg   *Take in the morning ? 0.5 mg   *Take in the morning   September 19 September 20 September 21 September 22 September 23 September 24 September 25   Stop taking, unless directed by surgeon                      Where to Get Your Medications      These medications were sent to [facility], [place] - [address]  [place], [place] [address]    Phone: [phone]    amLODIPine 10 mg tablet   dexAMETHasone 1 mg tablet  levETIRAcetam 1,000 mg tablet   lisinopriL 20 mg tablet   naloxone 4 mg/actuation Spraynaero   ondansetron 4 mg tablet     You can get these medications from any pharmacy    Bring a paper prescription for each of these medications   oxyCODONE 5 mg tablet  You don't need a prescription for these medications   acetaminophen 500 mg tablet   polyethylene glycol 17 gram packet   senna 8.6 mg tablet             Booked [facility] Appointments  Future Appointments   Date Time Provider [place]   02/03/2020  1:00 PM Ed Blalock, [place] [id number] [facility]   03/08/2020 10:00 AM Shawn Orlena Sheldon, [place] [id number] [facility]       Pending [place] Referrals  [place] Referrals Made (From admission, onward)     Ordered     Start    01/23/20 1151  Ambulatory referral to Occupational Therapy     Scheduling Instructions: Outpatient therapy script(s) have been provided for you. Please call [facility], number on the back of your insurance card, for a list of contracted providers in your desired geographical area (Please note that [facility] does not offer outpatient occupational therapy or outpatient speech therapy).      01/23/20 0000    01/21/20 2110  Ambulatory referral to Physical Therapy     Scheduling Instructions: Outpatient therapy script(s) have been provided for you. Please call [facility], number on the back of your insurance card, for a list of contracted providers in your desired geographical area (Please note that [facility] does not offer outpatient occupational therapy or outpatient speech therapy).      01/21/20 0000                Case Management Services Arranged  Case Management Services Arranged: (all recorded)           Discharge Assessment  Condition at discharge:  good  Final  Discharge Disposition: Home or Self Care    Primary Care Physician  Tresea Mall  Address: [address] [place] [address]   Phone: [phone]  Fax: [phone]     Outside Providers, for pending tests please use the following numbers:   For [facility] - Please Call: [phone]    For [facility] - Please Call: [phone]   For [facility] - Please Call: [phone]    Signed,  Iyonnah Ferrante Levora Angel, MD            Discharge Instructions provided to the patient (if any):            Patient Instructions         Craniotomy: What to Expect at Home  Your Recovery  A craniotomy is surgery to open your skull to fix a problem in your brain. It can be done for many reasons. For example, you may need a this surgery if your brain or blood vessels are damaged or if you have a tumor or an infection in your brain.  You will probably feel very tired for several weeks after surgery. You may also have headaches or problems concentrating. It can take 4 to 8 weeks to recover from surgery.  Your cuts (incisions) may be sore for about 5 days after surgery. Your scalp may swell with fluid. You may also have numbness and shooting pains near your wound. And you may have swelling and bruising around your eyes. As  your wound starts to heal, it may begin to itch. Medicines and ice packs can help with headaches, pain, swelling, and itching.  The stitches that hold your incisions together may go away on their own or will be removed in 7 to 10 days. This depends on the type of stitches the doctor uses.  Some kinds of plates stay attached to hold the skull flap to your head. If your head was shaved, you may want to wear hats or scarves on your head until your hair grows back. Or it may not bother you.  You may need to go to a short-term rehabilitation center after you leave the hospital. This can help you learn to do the tasks you need to do after you go home.  This care sheet gives you a general idea about  how long it will take for you to recover. But each person recovers at a different pace. Follow the steps below to get better as quickly as possible.  How can you care for yourself at home?  Activity    Rest when you feel tired. It is normal to want to sleep during the day. It is a good idea to plan to take a nap every day. Getting enough sleep will help you recover.     Try not to lie flat when you rest or sleep. You can use a wedge pillow, or you can put a rolled towel or foam padding under your pillow. You can also raise the head of your bed by putting bricks or wooden blocks under the bed legs.     After lying down, bring your head up slowly. This can prevent headaches or dizziness.     You can wash your hair 2 to 3 days after your surgery. But do not soak your head or swim for 2 to 3 weeks.     Do not dye or color your hair for 4 weeks after your surgery.     Try to walk each day. Start by walking a little more than you did the day before. Bit by bit, increase the amount you walk. Walking boosts blood flow and helps prevent pneumonia and constipation.     Avoid heavy lifting until your doctor says it is okay.     Do not drive for 2 to 3 weeks or until your doctor says it is okay.     Ask your doctor if it is safe for you to travel by plane.   Diet    You can eat your normal diet. If your stomach is upset, try bland, low-fat foods like plain rice, broiled chicken, toast, and yogurt.     Follow your doctor's orders about how much fluid you should drink after surgery.     Do not drink alcohol until your doctor says it is okay.     You may notice that your bowel movements are not regular right after your surgery. This is common. Try to avoid constipation and straining with bowel movements. You may want to take a fiber supplement every day. If you have not had a bowel movement after a couple of days, ask your doctor about taking a mild laxative.   Medicines    Your doctor will tell you if  and when you can restart your medicines. Your doctor will also give you instructions about taking any new medicines.     If you take aspirin or some other blood thinner, ask your doctor if and when to start taking  it again. Make sure that you understand exactly what your doctor wants you to do.     Be safe with medicines. Take pain medicines exactly as directed.  ? If the doctor gave you a prescription medicine for pain, take it as prescribed.  ? If you are not taking a prescription pain medicine, ask your doctor if you can take an over-the-counter medicine.     If you think your pain medicine is making you sick to your stomach:  ? Take your medicine after meals (unless your doctor has told you not to).  ? Ask your doctor for a different pain medicine.     If your doctor prescribed antibiotics, take them as directed. Do not stop taking them just because you feel better. You need to take the full course of antibiotics.     If you get medicines to prevent seizures, take them exactly as directed.   Incision care    If you have strips of tape on the incisions, leave the tape on for a week or until it falls off.     Keep the area clean and dry. Change the bandage every 2 days, or if it gets wet or soiled.     After your doctor says it is okay to shower or bathe, gently wash the surgery area with warm, soapy water and pat it dry.   Exercise    Avoid risky activities, such as climbing a ladder, for 3 months after surgery.     Avoid strenuous activities, such as bicycle riding, jogging, weight lifting, or aerobic exercise, for 3 months or until your doctor says it is okay.     Do not play any rough or contact sports for 3 months or until your doctor says it is okay.   Ice    For the first 1 or 2 days, you can use ice to reduce pain, swelling, and itching. Put ice or a cold pack on your head for 10 to 20 minutes at a time. Put a thin cloth between the ice and your skin.   Follow-up care is a key part  of your treatment and safety. Be sure to make and go to all appointments, and call your doctor if you are having problems. It's also a good idea to know your test results and keep a list of the medicines you take.  When should you call for help?   Call 911 anytime you think you may need emergency care. For example, call if:    You passed out (lost consciousness).     You have severe trouble breathing.     You have sudden chest pain and shortness of breath, or you cough up blood.     It is hard to think, move, speak, or see.     Your body is jerking or shaking.   Call your doctor now or seek immediate medical care if:    You have trouble thinking clearly.     You have a fever with a stiff neck or a severe headache.     Your incision comes open.     You have signs of infection, such as:  ? Increased pain, swelling, warmth, or redness.  ? Red streaks leading from the incision.  ? Pus draining from the incision.  ? Swollen lymph nodes in your neck, armpits, or groin.  ? A fever.     You have any sudden vision changes.     You have new or worse headaches.  You fall and hit your head.     You are sleeping more than you are awake.     You have pain that does not get better after you take pain medicine.     You have a fever over 100F.     You have a headache and you throw up.   Watch closely for changes in your health, and be sure to contact your doctor if you have any problems.  Where can you learn more?  Go to http://blackburn.com/  Enter I325 in the search box to learn more about "Craniotomy: What to Expect at Home."  Current as of: November 4, 2020Content Version: 12.9   2006-2021 [employer].   Care instructions adapted under license by your healthcare professional. If you have questions about a medical condition or this instruction, always ask your healthcare professional. [place] [facility] any warranty or liability for  your use of this information.      Brain: Anatomy Sketch     Current as of: January 19, 2021Content Version: 12.9   2006-2021 [employer].   Care instructions adapted under license by your healthcare professional. If you have questions about a medical condition or this instruction, always ask your healthcare professional. [place] [facility] any warranty or liability for your use of this information.    Malignant Brain Tumor (Primary): Care Instructions  Overview  A primary malignant brain tumor is cancer that begins in the brain. Cancer occurs when abnormal cells grow out of control. These tumors usually grow quickly. They can spread throughout the brain and sometimes to the spinal cord. As the tumors grow, they can affect important brain functions. Brain cancer can be deadly.  There are many types of malignant brain tumors. Treatment depends on tumor type and location in the brain. Treatment includes radiation, surgery, medicines (chemotherapy), or a combination of these.  Follow-up care is a key part of your treatment and safety. Be sure to make and go to all appointments, and call your doctor if you are having problems. It's also a good idea to know your test results and keep a list of the medicines you take.  How can you care for yourself at home?   Take your medicines exactly as prescribed. Call your doctor if you think you are having a problem with your medicine. You may get medicine for nausea and vomiting if you have these side effects.   Eat healthy food. If you do not feel like eating, try to eat food that has protein and extra calories to keep up your strength and prevent weight loss. Drink liquid meal replacements for extra calories and protein. Try to eat your main meal early.   Get some physical activity every day, but do not get too tired. Keep doing the hobbies you enjoy, as your energy allows.   Take steps to control your stress and workload. Learn  relaxation techniques.  ? Share your feelings. Stress and tension affect our emotions. By expressing your feelings to others, you may be able to understand and cope with them.  ? Consider joining a support group. Talking about a problem with your spouse, a good friend, or other people with similar problems is a good way to reduce tension and stress.  ? Express yourself through art. Try writing, dance, art, or crafts to relieve tension. Some dance, writing, or art groups may be available just for people who have cancer.  ? Be kind to your body and mind. Getting enough sleep,  eating a healthy diet, and taking time to do things you enjoy can contribute to an overall feeling of balance in your life and can help reduce stress.  ? Get help if you need it. Discuss your concerns with your doctor or counselor.   If you are vomiting or have diarrhea:  ? Drink plenty of fluids to prevent dehydration. Choose water and other caffeine-free clear liquids. If you have kidney, heart, or liver disease and have to limit fluids, talk with your doctor before you increase the amount of fluids you drink.  ? When you are able to eat, try clear soups, mild foods, and liquids until all symptoms are gone for 12 to 48 hours. Other good choices include dry toast, crackers, cooked cereal, and gelatin dessert, such as Jell-O.   If you have not already done so, prepare a list of advance directives. Advance directives are instructions to your doctor and family members about what kind of care you want if you become unable to speak or express yourself.  When should you call for help?   Call 911 anytime you think you may need emergency care. For example, call if:    You have a seizure (convulsions).     You passed out (lost consciousness).   Call your doctor now or seek immediate medical care if:    You have new or worse nausea or vomiting.     You have new or worse headaches.     You have new symptoms of brain problems, such as weakness,  numbness, or speech or vision changes.   Watch closely for changes in your health, and be sure to contact your doctor if:    You do not get better as expected.   Where can you learn more?  Go to http://blackburn.com/  Enter F468 in the search box to learn more about "Malignant Brain Tumor (Primary): Care Instructions."  Current as of: December 17, 2020Content Version: 12.9   2006-2021 Healthwise, Incorporated.   Care instructions adapted under license by your healthcare professional. If you have questions about a medical condition or this instruction, always ask your healthcare professional. [place] any warranty or liability for your use of this information.

## 2020-01-26 NOTE — Telephone Encounter (Signed)
We were called by Terry [name]. 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 His decadron dose is currently 2mg  daily. He will increase his dex to 3mg  BID. He has follow up scheduled with Dr Roselyn Reef in radiation oncology who will further taper his dex based on symptoms. Terry Warne and his family will contact us with any further symptoms and he has an upcoming neuro-onc visit.

## 2020-03-01 ENCOUNTER — Ambulatory Visit: Admit: 2020-03-01 | Payer: PRIVATE HEALTH INSURANCE | Attending: Physician | Primary: Person

## 2020-03-01 DIAGNOSIS — C719 Malignant neoplasm of brain, unspecified: Secondary | ICD-10-CM

## 2020-03-01 NOTE — Progress Notes (Signed)
This is a shared service.     This is a shared visit for services provided by me, Norva Karvonen, MD. I performed a face-to-face encounter with the patient and the following portion of the note is my own.    Subjective    Terry [name] is a 54 y.o. 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  Glioblastoma, IDH-wildtype, WHO grade IV.  He has a seizure on 9/8 but has not had any since.  He is still taking 69m in am and 2 mg in pm.  He is still having some difficulty with control of his right hand.  He is having some issues with his blood sugars.  He is currently having radiation [place]  Denies headache, seizures, nausea, vomiting, gate/balance disturbance, numbness, double vision, tingling or weakness.      Objective       A full physical exam is deferred due to the fact that this is a video visit and we are unable to evaluate in person.    Physical Exam  Neurological:      Mental Status: He is alert.      Comments: Incision- clean dry intact   Psychiatric:         Attention and Perception: Attention normal.         Mood and Affect: Mood normal.         Speech: Speech normal.         Behavior: Behavior normal.           Review of Prior Testing      CT Brain without Contrast  Narrative: CT BRAIN WITHOUT CONTRAST:  01/23/2020 2:07 AM    INDICATION (as provided by referring clinician): repeat HCT eval IPH    ADDITIONAL HISTORY: None    COMPARISON: MRI brain 01/21/2020     TECHNIQUE: Helical CT imaging of the brain without intravenous contrast. Coronal and sagittal reformatted images were obtained.    MEDICATIONS:  None    RADIATION DOSE INDICATORS:  Exposure Events: 3 , CTDIvol Max: 55.1 mGy, DLP: 1085.4 mGy.cm    FINDINGS:     Compared to 01/22/2020, no interval change in extra-axial and parenchymal hemorrhage in the left frontal and parietal lobes with evolving postsurgical changes.  Small subgaleal collection overlying the craniectomy has slightly increased in size now measuring 1.7 cm (previously 1.5 cm). No new intracranial hemorrhage, hydrocephalus or herniation.  Impression: 1.  Unchanged blood products in the left cerebral hemisphere.  2.  Mild interval increase in size of a postoperative soft tissue collection overlying the craniotomy.    Report dictated by: ADelia Heady DO, signed by: ARenee Harder MD, PhD  Department of Radiology and Biomedical Imaging  CT Brain without Contrast  Narrative: CT BRAIN WITHOUT CONTRAST:  01/22/2020 6:44 PM    INDICATION (as provided by referring clinician): seizure, r/o hemorrhage    ADDITIONAL HISTORY: None    COMPARISON: MRI brain 01/21/2020     TECHNIQUE: Helical CT imaging of the brain without intravenous contrast. Coronal and sagittal reformatted images were obtained.    MEDICATIONS:  None    RADIATION DOSE INDICATORS:  Exposure Events: 2 , CTDIvol Min: 55.6 mGy, CTDIvol Max: 55.6 mGy, DLP: 1221.5 mGy.cm    FINDINGS:     Evolving postsurgical changes of left frontoparietal tumor resection with scattered extra-axial and  parenchymal hemorrhage in the left cerebral hemisphere. No new intracranial hemorrhage compared with the brain MRI of 01/21/2020. No hydrocephalus or herniation.  Impression: Evolving postsurgical changes in the left frontoparietal region with scattered extra-axial and parenchymal hemorrhage. No new intracranial hemorrhage or hydrocephalus.    Report dictated by: Delia Heady, DO, signed by: Renee Harder, MD, PhD  Department of Radiology and Biomedical Imaging    Assessment and Plan        Terry [name] is a 70M with multifocal glioma now s/p L crani for tumor resection 9/2.  We were able to achieve GTR of both enhancing lesions. Glioblastoma, IDH-wildtype, WHO grade IV.  He is overall doing well but still having elevated serum glucose related to 22m daily of decadron. He is in the process of a continued decadron wean now that he is  3 weeks into XRT. From a functional standpoint, Terry CStanzionehas improved gross motor but continued fine motor dis coordination. additionally his speech is fluent but reading comprehension remains slowed compared with his pre diagnosis baseline. He is undergoing radiation and under the care of Dr [place]oselyn Reefin [place]  I would like to see his post radiation MRI for review as a film review visit.  He will continue with outpatient PT/OT and  continue to exercise with the goal of maximizing dexterity in his right hand.    I spent a total of 21 minutes on this patient's care on the day of their visit excluding time spent related to any billed procedures. This time includes time spent with the patient as well as time spent documenting in the medical record, reviewing patient's records and tests, obtaining history, placing orders, communicating with other healthcare professionals, counseling the patient, family, or caregiver, and/or care coordination for the diagnoses above.        I performed this evaluation using real-time telehealth tools, including a live video Zoom connection between my location and the patient's location. Prior to initiating, the patient consented to perform this evaluation using telehealth tools.

## 2020-03-02 NOTE — Telephone Encounter (Signed)
Pt had a video visit with Dr. Franchot Erichsen yesterday, 03/01/2020 and Dr. Franchot Erichsen said "I would like to see his post radiation MRI for review as a film review visit"    I left a message for pt asking him to forward his follow up brain MRI to me for Dr. Kristopher Oppenheim review.

## 2020-03-03 NOTE — Progress Notes (Signed)
Message from pt:    Radiation treatment includes 7-day week chemo with Temolozode.  I still have headaches but they are slight and mostly accure after the radiation treatments. I use Tylenol to treat the headaches.  I had an awful cause of Nausea a week ago.  I had an instance of AFib while at an Oncologist visit on 10/4. The Cardiologist Dr. Gilford Rile is waiting for the test results from a ZIO heart monitor.  There is considerable weakness in my legs. The loss of muscle and tonality is visible. Fortunately, it is improving with better sleep and light exercise. I have also just started physical rehab to address the inability to get up from a crouched position or the difficulty climbing stairs.  Change in prescriptions: No amlodipine, Lisinopril, naloxone, oxycodone, added Diltiazem, Losartan

## 2020-04-19 ENCOUNTER — Inpatient Hospital Stay: Admit: 2020-08-31 | Payer: PRIVATE HEALTH INSURANCE | Primary: Person

## 2020-06-29 ENCOUNTER — Inpatient Hospital Stay: Admit: 2020-08-31 | Payer: PRIVATE HEALTH INSURANCE | Primary: Person

## 2020-08-28 ENCOUNTER — Inpatient Hospital Stay: Admit: 2020-08-31 | Primary: Person

## 2020-09-12 NOTE — Telephone Encounter (Signed)
I received a call from Dominica with pts local oncologist's office Dr. Lonni Fix [phone].      Dr. Rosiland Oz would like to speak with Dr. Franchot Erichsen.  I informed Dr. Franchot Erichsen.    Pt will be discussed at the tumor board conference on 09/14/2020.  Dr. Franchot Erichsen plans to call Dr. Rosiland Oz after the TB discussion.

## 2020-10-30 ENCOUNTER — Inpatient Hospital Stay: Admit: 2020-11-01 | Primary: Person

## 2020-11-08 NOTE — Telephone Encounter (Signed)
Pts local oncologist, Dr. Rosiland Oz [phone]) is requesting [place] review pts recent brain imaging (imagign in [place]).    Dr. Franchot Erichsen is aware.  Pts case will be discussed at the Neuro-Onc tumor board conference on 11/09/2020.

## 2020-11-10 NOTE — Progress Notes (Signed)
Mr. Tax's oncologist (Dr. Rosiland Oz in [place], [place]) sent pt's recent brain MRI (10/30/2020) to [place] with request for review by Dr. Franchot Erichsen. The patient has new enhancement concerning for tumor progression. His case was presented at the [facility] Tumor board on Thursday 11/09/20.    Tumor board consensus: Favor enhancement to be post-treatment change. Continue to monitor.     Per Dr. Franchot Erichsen, assuming he will go back on TMZ, can repeat MRI in 2 months. However if oncologist remains worried, then a 4-6 week interval scan is also OK.     Will communicate the board consensus and recommendations with Dr. Rosiland Oz.     Allene Dillon MSN NP  Neurosurgery

## 2020-12-20 ENCOUNTER — Inpatient Hospital Stay: Admit: 2020-12-23 | Primary: Person

## 2020-12-21 ENCOUNTER — Inpatient Hospital Stay: Admit: 2020-12-23 | Primary: Person

## 2021-01-08 NOTE — Telephone Encounter (Signed)
From: Marya Landry   Sent: Monday, January 08, 2021 2:07 PM  To: Hervey-Jumper, Shawn L '@[place]'$ .edu>; Cung, Ana '@[place]'$ .edu>  Subject: phi; Pt requesting visit:     Hi Dr. Franchot Erichsen,   RE:  Artan, Voytko ([id number]).  Pt called requesting a video visit with you.  May I scheduled a video visit at 12:30pm on Wednesday August 24?    Caryl Pina     From: Celene Skeen '@[email or web address]>   Sent: Monday, January 01, 2021 5:30 PM  To: Spero Geralds '@[place]'$ .edu>; Jannet Mantis '@[place]'$ .edu>; Hervey-Jumper, Shawn L '@[place]'$ .edu>; Cung, Ana '@[place]'$ .edu>  Cc: Bazzell, Ashley '@[place]'$ .edu>  Subject: Re: [facility] patient    Hi Izora Gala, Nice to hear from you, and sorry for the delay getting back to you - was out on vacation. Will discuss with the patient and his med onc and get back to you if he is interested in pursuing the trial. Best,   ZjQcmQRYFpfptBannerStart  This Message Is From an External Sender    This message came from outside your organization.          [place] to hear from you, and sorry for the delay getting back to you - was out on vacation. Will discuss with the patient and his med onc and get back to you if he is interested in pursuing the trial. Maxcine Ham    From: Haywood Lasso, Frederich Balding '@[place]'$ .edu>  Sent: Thursday, December 28, 2020 1:37 PM  To: Celene Skeen '@[email or web address] .[email or web address]>; Jannet Mantis '@[place]'$ .edu>; Hervey-Jumper, Shawn L '@[place]'$ .edu>; Cung, Ana '@[place]'$ .edu>  Cc: Bazzell, Ashley '@[place]'$ .edu>  Subject: Re: [facility] patient      EXTERNAL EMAIL - If suspicious, do not click on links or open attachments and use Phish Alert to report.    Dear Ronalee Belts,    We presented him at tumor board.  If good functional status (and no prior bevacizumab, and confirming  this is first recurrence?), then he could be a candidate for our CNS201 clinical trial which is bebarubicin vs. CCNU (2:1 randomization).  If poor functional status would do CCNU/BEv.    Best,  Royston Sinner MD, PhD   Clinical Director, Division of Neuro-Oncology  [facility]  [email or web address]     Practice Coordinator: Tedra Senegal  Ph: [phone]  Fax: [phone]  Email: [email or web address]         From: Celene Skeen '@[email or web address] .[email or web address]>  Sent: Monday, December 25, 2020 9:48 AM  To: Jannet Mantis '@[place]'$ .edu>; Hervey-Jumper, Shawn L '@[place]'$ .edu>; Cung, Ana '@[place]'$ .edu>  Cc: Bazzell, Ashley '@[email or web address]'$ .edu>; Haywood Lasso, Frederich Balding '@[place]'$ .edu>  Subject: Re: [facility] patient      Thanks all! Enrique Sack, MD Radiation Oncologist [place] From:?? Jannet Mantis <[email or web address]> Sent:?? Monday, December 25, 2020 8:??48 AM   ZjQcmQRYFpfptBannerStart  This Message Is From an External Sender   This message came from outside your organization.      ZjQcmQRYFpfptBannerEnd  Thanks all!     Enrique Sack, MD  Radiation Oncologist  [place], [place]    From: Jannet Mantis '@[place]'$ .edu>  Sent: Monday, December 25, 2020 8:48 AM  To: Hervey-Jumper, Shawn L '@[place]'$ .edu>; Celene Skeen '@[email or web address] .[email or web address]>; Cung, Ana '@[place]'$ .edu>  Cc: Bazzell, Ashley '@[place]'$ .edu>; Haywood Lasso, Frederich Balding '@'$ .edu>  Subject: RE: [facility] patient      EXTERNAL EMAIL - If suspicious, do not click on links or open  attachments and use Phish Alert to report.    Good morning Dr. Franchot Erichsen,      The patient has been added to TB.      Thank you,   Orma Render      From: Hervey-Jumper, Shawn L '@[place]'$ .edu>   Sent: Monday, December 25, 2020 8:17 AM  To:  Celene Skeen '@[email or web address] .[email or web address]>; Cung, Ana '@[place]'$ .edu>; Jannet Mantis '@[place]'$ .edu>  Cc: Bazzell, Ashley '@[place]'$ .edu>; Haywood Lasso, Frederich Balding '@[place]'$ .edu>  Subject: Re: [facility] patient     Im sorry to hear the news. Caryl Pina, could we get the updated imaging. Orma Render, this patient will need to be added to Thursday BTB. Wilhemena Durie can send a separate email with the details. Thanks as always.      -shj     From: Celene Skeen '@[email or web address] .[email or web address]>  Date: Sunday, December 24, 2020 at 9:30 PM  To: "Hervey-Jumper, Shawn L" '@[place]'$ .edu>, "Cung, Ana" '@[place]'$ .edu>  Cc: "Bazzell, Ashley" '@'$ .edu>  Subject: Re: [facility] patient     Hi Shawn, Thanks for all your previous help with [employer].?? Unfortunately, he presented last week with breakthrough seizures and was found to have fairly unequivocal multifocal progression, both in and out of field, with extension across   ZjQcmQRYFpfptBannerStart  This Message Is From an External Sender    This message came from outside your organization.          ZjQcmQRYFpfptBannerEnd  Hi Shawn,     Thanks for all your previous help with [employer]. Unfortunately, he presented last week with breakthrough seizures and was found to have fairly unequivocal multifocal progression, both in and out of field, with extension across the callosum to the contralateral hemisphere. I think he is a poor candidate for any further local therapy and will discuss salvage systemic therapy with his local med onc.      I was wondering if you would be willing to present his case again at tumor board to get the group's recommendation for next steps. He is open to any potential treatment, including clinical trials if there is one for which he is a candidate. I've pushed his latest scan to  for review.       Thanks again,     [employer]

## 2021-01-09 MED ORDER — LOSARTAN 25 MG TABLET: 25 mg | ORAL | Status: AC

## 2021-01-09 MED ORDER — LEVETIRACETAM 750 MG TABLET: 750 mg | ORAL | Status: DC

## 2021-01-09 MED ORDER — MELATONIN ORAL: ORAL | Status: AC

## 2021-01-09 MED ORDER — ZOLPIDEM ER 6.25 MG TABLET,EXTENDED RELEASE,MULTIPHASE: 6.25 mg | ORAL | Status: AC | PRN

## 2021-01-09 NOTE — Telephone Encounter (Signed)
Called patient and reviewed/confirmed medication.

## 2021-01-10 ENCOUNTER — Ambulatory Visit: Admit: 2021-01-12 | Discharge: 2021-01-20 | Payer: PRIVATE HEALTH INSURANCE | Attending: Physician | Primary: Person

## 2021-01-10 DIAGNOSIS — C719 Malignant neoplasm of brain, unspecified: Secondary | ICD-10-CM

## 2021-01-10 NOTE — Progress Notes (Unsigned)
<  APPNOTE>   Subjective    Terry [name] is a 55 y.o. male who presents with the following:    Chief Complaint            Follow-up              History of Present Illness   Terry [name] is a 55 y.o. male with multifocal glioma now s/p L crani for tumor resection 01/19/21, pathology: Glioblastoma, IDH-wildtype, WHO grade IV, followed by chemo and radiation. He is followed by Dr. Celene Skeen at [facility] in [place].   He recently experienced breakthrough seizures and found to have multifocal progression on MRI. Pt was discussed at [place] board to discuss possible consideration of clinical trial vs other treatment recommendations. He has additional questions and is here today for follow up.         Objective      Vitals    Flowsheet Row Most Recent Value   Weight 117 kg (258 lb)   Height 175.3 cm ('5\' 9"'$ )   Pain Score 0   BMI (Calculated) 38.2            Physical Exam   KPS: ***  Neurologic Exam:  Appropriate attention, memory, and affect. Speech fluent without dysarthria or aphasia ***- specifically, naming, repetition and comprehension are intact.   Cranial Nerves:  Extraocular movements intact without nystagmus.  Face symmetric, with facial sensation symmetric to LT by report.*** Tongue midline.     Motor Exam: No drift. Symmetric finger tapping. Antigravity in both legs; able to stand up easily without using arms.***  Sensation: intact to LT throughout all extremities per report. ***  Stance and Gait: Normal gait.       Review of Prior Testing  Outside MRI brain with and without contrast 12/21/2020:         Assessment and Plan       {Assessment and plan options:32133}                {Complexity of data reviewed (optional):28233::" "}    {Time spent options (optional):28235::" "}      I performed this evaluation using real-time telehealth tools, including a live video Zoom connection between my location and the patient's location. Prior to initiating, the patient consented to perform this evaluation using  telehealth tools.     {Failed Video Visit Note (optional):[phone]::" "}

## 2021-01-10 NOTE — Telephone Encounter (Incomplete)
Tumor type: ***  Molecular: ***  Location: ***  Age at diagnosis: ***    Treatment history:  1. MRI revealed enhancing or non-enhancing        -date of most recent treatment:  -planned start date of next treatment:    -Referral source: ***  -Reason: Dr. Tawni Pummel for [id number]  -Records in   -images   -Most recent MRI: ***  -Imaging Confirmation: ***  -Patient prefers:     Clinical Trial Review:  -Clinical Trial:   -Eligibility concerns:    NN Assessment:  -Weight:  -Do you have a programmable shunt or pacemaker?  -Language:  -Caregiver:  -Patient communication preferences:  -City of residence:  -{Distance from [id number]  -Insurance:  -Does patient have access to Internet and a Stage manager or computer with a camera?  -Barrier/concern identified:  -If our reviewing physician would like pathology review or genetic testing do we have your permission to move forward with these additional tests?  -Scientist, clinical (histocompatibility and immunogenetics) provided?  -MyChart Activated:   -Date Mychart Link Sent:    -Name of radiology facility:   -Oncologist:  -Radiation Oncologist:  -Neurosurgeon:

## 2021-01-16 ENCOUNTER — Ambulatory Visit: Admit: 2021-01-17 | Payer: PRIVATE HEALTH INSURANCE | Attending: Neurology | Primary: Person

## 2021-01-16 DIAGNOSIS — C719 Malignant neoplasm of brain, unspecified (CMS code): Secondary

## 2021-01-16 MED ORDER — FLUOXETINE 20 MG TABLET: 20 mg | ORAL | Status: AC

## 2021-01-16 MED ORDER — LEVETIRACETAM 1,000 MG TABLET: 1,000 mg | ORAL | Status: AC

## 2021-01-16 MED ORDER — DEXAMETHASONE 0.5 MG TABLET
0.5 | Freq: Every day | ORAL | 0.00 refills | 6.00000 days | Status: AC
Start: 2021-01-16 — End: ?

## 2021-01-16 NOTE — Patient Instructions (Addendum)
It was great to see you this past week. We can start the screening for the clinical trial, but you would need to be off treatment with the [place] for 4 weeks before you could start therapy.  Please let us know if you would like to proceed with the trial, and if not we would recommend CCNU and bevacizumab.    My coordinator will be in touch to coordinate the next MRI and appointment, or she can be reached at [phone].     Please let me know if you have questions.     Best,  Frederich Balding      Please see [place] for additional resources.

## 2021-01-16 NOTE — Progress Notes (Unsigned)
NEURO-ONCOLOGY CONSULT NOTE    DATE OF VISIT: 01/15/21    REFERRING PHYSICIAN:   Hervey-Jumper, Ardelle Lesches*    PRIMARY CARE PHYSICIAN: Not Confirmed Provider    REASON FOR CONSULTATION: Patient was diagnosed with glioblastom after presenting with right hand fine motor difficulties, and right upper extremity and right facial paraesthesias. MRI on 12/21/19 revealed left frontal enhancing lesions. A gross total resection was performed by Dr. Franchot Erichsen at [facility] on 02/08/20. The patient has completed fractionated radiation on 03/21/20 and 6 cycles of adjuvant temozolomide on 09/2020.      Terry [name] is a 55 y.o., man with the following tumor history:    Tumor type: glioblastoma, IDH wildtype, MGMT methylated  Molecular: [id number]  Location: left frontal  Age at diagnosis: 55 year-old right-handed male    Treatment history:  1. 12/21/2019: Presented with right-hand fine motor difficulties and right upper extremity and right facial parasthesias. MRI revealed left frontal enhancing lesions.  2. 12/25/2019: biopsy by Dr. Andres Labrum at [facility]. Pathology: glioblastoma  3. 01/20/2020: gross total resection by Dr. Norva Karvonen at [facility]. Pathology: glioblastoma  4. 02/08/2020 - 03/21/2020: fractionated radiation by Dr. Celene Skeen and concurrent temozolomide under the direction of Dr. Lonni Fix at [facility].  5. 04/20/2020 - 09/2020: 6 cycles of adjuvant temozolomide and Optune TTF    HISTORY OF PRESENT ILLNESS:  Terry [name] is a 55 y.o. right-handed male who is diagnosed with glioblastom after presenting with right hand fine motor difficulties, and right upper extremity and right facial paraesthesias. MRI on 12/21/19 revealed left frontal enhancing lesions. A gross total resection was performed by Dr. Franchot Erichsen at [facility] on 02/08/20. The patient has completed fractionated radiation on 03/21/20 and 6 cycles of adjuvant temozolomide on 09/2020.      Today he presents with ***    PAST MEDICAL  HISTORY:  Past Medical History:   Diagnosis Date    Glioblastoma (CMS code)      PAST SURGICAL HISTORY:  Past Surgical History:   Procedure Laterality Date    BRAIN BIOPSY  12/25/2019     FAMILY HISTORY:  No family history on file.    SOCIAL HISTORY:  Social History     Socioeconomic History    Marital status: Married   Tobacco Use    Smoking status: Never Smoker   Substance and Sexual Activity    Alcohol use: Yes     Comment: Social    Drug use: Never     MEDICATIONS:  No outpatient medications have been marked as taking for the 01/16/21 encounter (Appointment) with Jearld Pies, MD.     ALLERGIES:  Allergies as of 01/16/2021    (No Known Allergies)     REVIEW OF SYSTEMS: Over the last 30 days, the patient also attests to ***.    Patient denies chest pain, shortness of breath, new skin lesions. A complete 14 point review of systems was performed and pertinent positives and negative recorded in the HPI.    PHYSICAL EXAMINATION:     Vital Signs: There were no vitals taken for this visit.    KPS: ***  ECOG: ***    General Exam:  He is well a appearing male in no acute distress. Head and neck: Well-healed craniotomy scar without drainage or erythema. Oropharynx clear without lesions. Lungs: clear to auscultation bilaterally. Heart: regular rate and rhythm. No murmurs, rubs or gallop. Abdomen: soft and non-tender without palpable masses. Extremities: Lower extremities without peripheral edema  or calf tenderness. Skin: no rash.     Neurologic Exam: Oriented to person, place, and time. Appropriate attention, memory, and affect. Speech fluent without dysarthria or aphasia - specifically naming, repetition, and comprehension were intact.   Cranial Nerves: Pupils equally round and reactive to light. Extraocular movements intact without nystagmus. Visual fields full to confrontation. Face symmetric with sensation intact V1-3 to light touch bilaterally. Hearing symmetric to finger rub. Tongue  midline. Uvula and palate symmetric.    Motor Exam: No drift. Normal and symmetric bulk and tone. No orbiting. Symmetric finger tapping. Strength 5/5 throughout.   Sensation: Intact to light touch in the upper and lower extremities bilaterally.   Coordination: No dysmetria on finger-to-nose testing.   Stance and Gait: Normal gait. Able to walk on toes, heels, and tandem without difficulty.    IMAGING: I personally reviewed the most recent MRI with the patient and family from 01/03/21, and compared it to previous MRI from 12/21/20 demonstrating ***    Report  From [place]   Compared to 12/21/20  Impression:   1. Compared to the MRI dated 12/21/2020 there is significant enlargement of several areas of abnormal enhancement in the left frontal lobe, anterior aspect of the corpus callosum, and within the right frontal lobe with restricted diffusion. T2/FLAIR   signal abnormality has also enlarged to a lesser degree. The degree of progressive enlargement and periventricular location of this enhancement favors enlarging neoplasm over treatment sequelae. An MRI cerebral perfusion without and with contrast and MR   spectroscopy can be performed to further distinguish neoplasm from treatment sequelae if clinically warranted.   2. An empty sella, enlargement of the optic nerve sheaths, and enlarged superior ophthalmic veins are unchanged compared to 12/21/2019 and consistent with increased intracranial pressure, either due to the intra-axial pathology or idiopathic intracranial   hypertension.     Patient Active Problem List   Diagnosis    Glioblastoma (CMS code)    Preop testing     ASSESSMENT AND PLAN:  In summary, Terry [name] is a 55 y.o. year old man with a glioblastom after presenting with right hand fine motor difficulties, and right upper extremity and right facial paraesthesias. MRI on 12/21/19 revealed left frontal enhancing lesions. A gross total resection was performed by Dr. Franchot Erichsen at [facility] on  02/08/20. The patient has completed fractionated radiation on 03/21/20 and 6 cycles of adjuvant temozolomide on 09/2020.  Neurologically, he *** His imaging is significant for ***     After reviewing the most recent imaging, ***      1. Glioblastoma   -     Follow-up:  - *** for next MRI and appointment. This scan is critical because based on the results, a change in the management plan for the patient may need to be made especially if there is any evidence to suggest tumor progression.    - All questions of Terry [name] and his family were answered to the best of my ability. They have our contact information and will call with further questions.     No orders of the defined types were placed in this encounter.    I spent a total of *** minutes on this patient's care on the day of their visit excluding time spent related to any billed procedures. This time includes time spent with the patient as well as time spent documenting in the medical record, reviewing patient's records and tests, obtaining history, placing orders, communicating with other healthcare professionals, counseling the  patient, family, or caregiver, and/or care coordination for the diagnoses above.    I, Launa Flight am acting as a Education administrator for services provided by Jearld Pies, MD on 01/15/21  1:39 PM.    Marland Kitchen

## 2021-01-24 NOTE — Progress Notes (Signed)
[facility] Better Caregiver Program  New to Clinic - Email Introduction    Emailed patient's caregiver introducing Caregiver Program and provided introductory CG resources and support information.    Emailed caregiver the following:  Orientation to [place] resources  Support Group and peer support information  Supportive Care Services link

## 2023-10-19 LAB — SAPIO LEGACY CONVERTED DATA: Encounter Number: 159175375
